# Patient Record
Sex: Female | Born: 1970 | Race: Black or African American | Hispanic: No | Smoking: Never smoker
Health system: Southern US, Community
[De-identification: ages and names within clinical notes are randomized; demographics above are authoritative.]

## PROBLEM LIST (undated history)

## (undated) DIAGNOSIS — J45909 Unspecified asthma, uncomplicated: Secondary | ICD-10-CM

## (undated) DIAGNOSIS — I10 Essential (primary) hypertension: Secondary | ICD-10-CM

## (undated) DIAGNOSIS — D219 Benign neoplasm of connective and other soft tissue, unspecified: Secondary | ICD-10-CM

## (undated) DIAGNOSIS — R200 Anesthesia of skin: Secondary | ICD-10-CM

## (undated) DIAGNOSIS — M5430 Sciatica, unspecified side: Secondary | ICD-10-CM

## (undated) DIAGNOSIS — J189 Pneumonia, unspecified organism: Secondary | ICD-10-CM

## (undated) HISTORY — DX: Benign neoplasm of connective and other soft tissue, unspecified: D21.9

## (undated) HISTORY — PX: TUBAL LIGATION: SHX77

---

## 1998-11-09 ENCOUNTER — Ambulatory Visit (HOSPITAL_COMMUNITY): Admission: RE | Admit: 1998-11-09 | Discharge: 1998-11-09 | Payer: Self-pay | Admitting: Family Medicine

## 1998-11-09 ENCOUNTER — Encounter: Payer: Self-pay | Admitting: Family Medicine

## 1999-01-10 ENCOUNTER — Emergency Department (HOSPITAL_COMMUNITY): Admission: EM | Admit: 1999-01-10 | Discharge: 1999-01-10 | Payer: Self-pay | Admitting: *Deleted

## 1999-01-10 ENCOUNTER — Encounter: Payer: Self-pay | Admitting: *Deleted

## 1999-08-04 ENCOUNTER — Other Ambulatory Visit: Admission: RE | Admit: 1999-08-04 | Discharge: 1999-08-04 | Payer: Self-pay | Admitting: Family Medicine

## 2005-10-21 ENCOUNTER — Emergency Department (HOSPITAL_COMMUNITY): Admission: EM | Admit: 2005-10-21 | Discharge: 2005-10-21 | Payer: Self-pay | Admitting: Emergency Medicine

## 2007-09-28 ENCOUNTER — Ambulatory Visit (HOSPITAL_COMMUNITY): Admission: RE | Admit: 2007-09-28 | Discharge: 2007-09-28 | Payer: Self-pay | Admitting: Family Medicine

## 2008-04-24 ENCOUNTER — Ambulatory Visit (HOSPITAL_COMMUNITY): Admission: RE | Admit: 2008-04-24 | Discharge: 2008-04-24 | Payer: Self-pay | Admitting: Obstetrics and Gynecology

## 2008-04-24 ENCOUNTER — Encounter (INDEPENDENT_AMBULATORY_CARE_PROVIDER_SITE_OTHER): Payer: Self-pay | Admitting: Obstetrics and Gynecology

## 2008-04-29 ENCOUNTER — Emergency Department (HOSPITAL_COMMUNITY): Admission: EM | Admit: 2008-04-29 | Discharge: 2008-04-29 | Payer: Self-pay | Admitting: Emergency Medicine

## 2010-02-06 ENCOUNTER — Encounter: Payer: Self-pay | Admitting: Family Medicine

## 2010-04-27 LAB — BASIC METABOLIC PANEL
CO2: 25 mEq/L (ref 19–32)
Calcium: 9.2 mg/dL (ref 8.4–10.5)
Chloride: 102 mEq/L (ref 96–112)
Creatinine, Ser: 0.77 mg/dL (ref 0.4–1.2)
GFR calc Af Amer: >60 mL/min (ref >60)
Sodium: 137 mEq/L (ref 135–145)

## 2010-04-27 LAB — DIFFERENTIAL
Basophils Absolute: 0 10*3/uL (ref 0.0–0.1)
Basophils Relative: 0 % (ref 0–1)
Eosinophils Absolute: 0.3 10*3/uL (ref 0.0–0.7)
Eosinophils Relative: 4 % (ref 0–5)
Lymphocytes Relative: 41 % (ref 12–46)
Lymphs Abs: 2.9 10*3/uL (ref 0.7–4.0)
Monocytes Absolute: 0.6 10*3/uL (ref 0.1–1.0)
Monocytes Relative: 9 % (ref 3–12)
Neutro Abs: 3.2 10*3/uL (ref 1.7–7.7)
Neutrophils Relative %: 45 % (ref 43–77)

## 2010-04-27 LAB — CBC
Hemoglobin: 9.4 g/dL — ABNORMAL LOW (ref 12.0–15.0)
Hemoglobin: 9.1 g/dL — ABNORMAL LOW (ref 12.0–15.0)
MCV: 85.2 fL (ref 78.0–100.0)
Platelets: 501 10*3/uL — ABNORMAL HIGH (ref 150–400)
RBC: 3.42 MIL/uL — ABNORMAL LOW (ref 3.87–5.11)
RBC: 3.29 MIL/uL — ABNORMAL LOW (ref 3.87–5.11)
RDW: 16.7 % — ABNORMAL HIGH (ref 11.5–15.5)
WBC: 7.1 10*3/uL (ref 4.0–10.5)

## 2010-04-27 LAB — HCG, SERUM, QUALITATIVE: Preg, Serum: NEGATIVE

## 2010-04-27 LAB — D-DIMER, QUANTITATIVE: D-Dimer, Quant: 0.73 ug/mL-FEU — ABNORMAL HIGH (ref 0.00–0.48)

## 2010-05-31 NOTE — H&P (Signed)
NAMEJAIMEY, FRANCHINI NO.:  [id number]   MEDICAL RECORD NO.:  [id number]          PATIENT TYPE:  AMB   LOCATION:                                FACILITY:  [facility]   PHYSICIAN:  Osborn Coho, M.D.   DATE OF BIRTH:  [birth year]   DATE OF ADMISSION:  04/24/2008  DATE OF DISCHARGE:                              HISTORY & PHYSICAL   HISTORY OF PRESENT ILLNESS:  Ms. Edwyna Ready is a 40 year old married African  American female para 2-0-0-2 who presents for hysteroscopy with D and C  and hydrothermal ablation because of a submucosal fibroid and  menorrhagia.  For over 2 years, the patient has had increasingly heavy  and extended menstrual flow with her worse episodes occurring over the  past 3 months.  The patient reports having a flow that will last  anywhere from 7-21 consecutive days, during which time she has to change  a super plus tampon and overnight pad on hourly basis.  In spite of  this, the patient still soil her clothes.  She also experiences 7/10 on  a 10-point pain scale, menstrual cramping, which will radiate to both of  her legs; however, she is able to find relief with Aleve, which will  decrease her pain to a 2/10 on a 10-point pain scale.  She denies any  urinary tract symptoms, any changes in bowel movements, nausea,  vomiting, diarrhea, fever or back pain.  In an effort to curtail the  quantity of the patient's menstrual flow, she was prescribed Ovcon 35 as  well as Provera 20 mg daily.  Neither of which, had any significant  impact on the patient's bleeding.  On January 2010, the patient's  hemoglobin was 10, hematocrit 32.5,  TSH and prolactin were within  normal limits, and pelvic ultrasound revealed a uterus measuring 5.0 x  9.8 x 7.2 cm with an anterior pedunculated fibroid measuring 5.1 x 5.0 x  5.1 cm and endometrium containing a hypoechoic mass measuring 2.8 x 2.4  cm.  The patient's right and left ovaries were within normal range.  The  patient was  given management options for her symptoms to include  hysteroscopy with D and C with and without endometrial ablation and  hysterectomy.  The patient has decided to proceed with hysteroscopy D  and C with hydrothermal ablation.   PAST MEDICAL HISTORY:   ALLERGIES:  The patient drug allergies, which are CODEINE, which causes  her to be hyperactive.  She also is allergic to NUTS, which causes her  throat to swell, severe diarrhea, and itching.   OB HISTORY:  Gravida 2, para 2-0-0-2.  The patient underwent 2 cesarean  sections.   GYN HISTORY:  Menarche at 40 years old.  The patient's last menstrual  period has been continuous for the past 3 weeks.  She uses bilateral  tubal ligation as her method of contraception.  Denies any history of  sexually transmitted diseases.  Denies any history of abnormal Pap  smears.   PAST MEDICAL HISTORY:  Anemia and bilateral lung pneumonia.   PAST SURGICAL HISTORY:  Surgical  history is negative.  The patient  denies any problems with anesthesia or any history of blood  transfusions.   FAMILY HISTORY:  Positive for cardiovascular disease, hypertension,  thyroid cancer, diabetes mellitus, rheumatoid arthritis and  schizophrenia (father).   HABITS:  The patient does not use tobacco, alcohol, or illicit drugs.   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   CURRENT MEDICATIONS:  1. Sudafed 12-hour, 1 tablet every 12 hours.  2. Iron Integra one tablet daily.  (The patient was given an injection      of progestin last week while on cruise in an effort to curtail      her bleeding; however specifics are not available and the patient      had no response with this injection).   REVIEW OF SYSTEMS:  The patient wears a permanent retainer in her mouth.  She denies any chest pain, shortness of breath, headache, vision  changes, nausea, vomiting, diarrhea, urinary tract symptoms,  myalgias,  arthralgias, fever, dizziness, blurred vision, and except as mentioned  in history of present illness the patient's review of systems is  otherwise negative.   PHYSICAL EXAMINATION:  VITAL SIGNS:  Blood pressure 130/62, pulse is 86,  weight is 267 pounds, and height 5 feet 5 inches.  Body mass index 44.4.  NECK:  Supple without masses.  There is no thyromegaly or cervical  adenopathy.  HEART:  Regular rate and rhythm.  LUNGS:  Clear.  BACK:  No CVA tenderness.  ABDOMEN:  No tenderness, guarding, rebound, or organomegaly.  PELVIC:  EGBUS is normal.  Vagina, large amount of blood with small  clots.  Cervix is nontender without lesions.  Uterus appears normal  size, shape and consistency.  However, exam is precluded or limited by  body habitus.  Adnexa without tenderness or masses.   IMPRESSION:  1. Menorrhagia.  2. Submucosal fibroid.  3. Anemia.   DISPOSITION:  A discussion was held with the patient regarding the  indications for her procedure along with its risks, which include but  are not limited to reaction to anesthesia, damage to adjacent organs,  infection, excessive bleeding and the possibility that her bleeding  pattern may not significantly improve as a result of her surgery.  The  patient verbalized understanding of these risks and has consented to  proceed with hysteroscopy with D and C and hydrothermal ablation.  The  patient has consented to proceed with hysteroscopy with D and C and  hydrothermal ablation at [facility] April 24, 2008  at 8:45 a.m.      [place] J. Adline Peals.      Osborn Coho, M.D.  Electronically Signed    EJP/MEDQ  D:  04/22/2008  T:  04/23/2008  Job:  [id number]

## 2010-05-31 NOTE — Op Note (Signed)
NAMEMarland Gill  JARAE, NEMMERS       ACCOUNT NO.:  [id number]   MEDICAL RECORD NO.:  [id number]          PATIENT TYPE:  AMB   LOCATION:  [facility]C                           FACILITY:  [facility]   PHYSICIAN:  Osborn Coho, M.D.   DATE OF BIRTH:  February 02, 1970   DATE OF PROCEDURE:  DATE OF DISCHARGE:                               OPERATIVE REPORT   PREOPERATIVE DIAGNOSES:  1. Menorrhagia.  2. Submucosal fibroid.  3. Anemia.   POSTOPERATIVE DIAGNOSES:  1. Menorrhagia.  2. Submucosal fibroid.  3. Anemia.   PROCEDURES:  1. Hysteroscopy.  2. Dilation and curettage.  3. Hydrothermal ablation.   ATTENDING:  Osborn Coho, MD   ANESTHESIA:  General.   FINDINGS:  Uterus sounded to 12 cm.   SPECIMENS TO PATHOLOGY:  Endometrial curettings.   FLUIDS:  1600 mL.   URINE OUTPUT:  Quantity sufficient.   ESTIMATED BLOOD LOSS:  Minimal.   COMPLICATIONS:  None.   PROCEDURE IN DETAIL:  The patient was taken to the operating room after  the risks, benefits, and alternatives discussed with the patient.  The  patient verbalized understanding and consent signed and witnessed.  The  patient was placed under general anesthesia and prepped and draped in a  normal sterile fashion in the dorsal lithotomy position.  A weighted  speculum was placed in the patient's vagina and the anterior lip of the  cervix was grasped with a single-tooth tenaculum.  Paracervical block  was administered using a total of 10 mL of 1% lidocaine.  The cervix was  already sufficiently dilated for passage of the hysteroscopic  hydrothermal ablation instrument.  __________ 12 cm, the hysteroscope  was introduced and as the patient was on her cycle, decision was made to  do D&C at this time to help control the bleeding.  Curettage was  performed until a gritty texture was noted and hysteroscope reintroduced  and the cavity was able to be visualized as well as the right ostia.  The ablation was performed and no noted deficits were  noted.  There was  still some bleeding noted in the left fundal region of the cavity and  ablation was performed for another 4 minutes, and no deficits noted.  All instruments were removed at the end of the case.  There was some  bleeding at the tenaculum sites and silver nitrate was applied to the  left tenaculum site.  There was good hemostasis.  Sponge, lap, and  needle count was correct.  The patient tolerated the procedure and is  currently being transferred to the recovery room in good condition.      Osborn Coho, M.D.  Electronically Signed     AR/MEDQ  D:  04/24/2008  T:  04/25/2008  Job:  [id number]

## 2010-10-19 LAB — COMPREHENSIVE METABOLIC PANEL
ALT: 34
AST: 27
CO2: 28
Chloride: 104
GFR calc Af Amer: >60
GFR calc non Af Amer: >60
Potassium: 4.1
Sodium: 139
Total Bilirubin: 0.5

## 2010-10-19 LAB — LIPID PANEL: Triglycerides: 80

## 2011-10-25 ENCOUNTER — Other Ambulatory Visit (HOSPITAL_COMMUNITY)
Admission: RE | Admit: 2011-10-25 | Discharge: 2011-10-25 | Disposition: A | Discharge status: Home / Self Care | Payer: BC Managed Care – PPO | Source: Ambulatory Visit | Attending: Family Medicine | Admitting: Family Medicine

## 2011-10-25 DIAGNOSIS — Z01419 Encounter for gynecological examination (general) (routine) without abnormal findings: Secondary | ICD-10-CM | POA: Insufficient documentation

## 2011-10-25 DIAGNOSIS — Z1151 Encounter for screening for human papillomavirus (HPV): Secondary | ICD-10-CM | POA: Insufficient documentation

## 2012-02-07 ENCOUNTER — Emergency Department (HOSPITAL_COMMUNITY): Payer: BC Managed Care – PPO

## 2012-02-07 ENCOUNTER — Encounter (HOSPITAL_COMMUNITY): Payer: Self-pay | Admitting: *Deleted

## 2012-02-07 ENCOUNTER — Emergency Department (HOSPITAL_COMMUNITY)
Admission: EM | Admit: 2012-02-07 | Discharge: 2012-02-08 | Disposition: A | Discharge status: Home / Self Care | Payer: BC Managed Care – PPO | Attending: Emergency Medicine | Admitting: Emergency Medicine

## 2012-02-07 DIAGNOSIS — R6883 Chills (without fever): Secondary | ICD-10-CM | POA: Insufficient documentation

## 2012-02-07 DIAGNOSIS — R1032 Left lower quadrant pain: Secondary | ICD-10-CM | POA: Insufficient documentation

## 2012-02-07 DIAGNOSIS — Z90710 Acquired absence of both cervix and uterus: Secondary | ICD-10-CM | POA: Insufficient documentation

## 2012-02-07 DIAGNOSIS — Z3202 Encounter for pregnancy test, result negative: Secondary | ICD-10-CM | POA: Insufficient documentation

## 2012-02-07 DIAGNOSIS — I10 Essential (primary) hypertension: Secondary | ICD-10-CM | POA: Insufficient documentation

## 2012-02-07 DIAGNOSIS — D259 Leiomyoma of uterus, unspecified: Secondary | ICD-10-CM

## 2012-02-07 HISTORY — DX: Essential (primary) hypertension: I10

## 2012-02-07 LAB — CBC WITH DIFFERENTIAL/PLATELET
Basophils Relative: 0 % (ref 0–1)
Eosinophils Absolute: 0.1 10*3/uL (ref 0.0–0.7)
Eosinophils Relative: 1 % (ref 0–5)
MCH: 30.4 pg (ref 26.0–34.0)
MCHC: 33.7 g/dL (ref 30.0–36.0)
Monocytes Relative: 12 % (ref 3–12)
Neutrophils Relative %: 59 % (ref 43–77)
Platelets: 323 10*3/uL (ref 150–400)

## 2012-02-07 LAB — BASIC METABOLIC PANEL
BUN: 9 mg/dL (ref 6–23)
Calcium: 8.6 mg/dL (ref 8.4–10.5)
GFR calc Af Amer: >90 mL/min (ref >90)
GFR calc non Af Amer: 79 mL/min — ABNORMAL LOW (ref >90)
Potassium: 3.8 mEq/L (ref 3.5–5.1)
Sodium: 132 mEq/L — ABNORMAL LOW (ref 135–145)

## 2012-02-07 LAB — URINALYSIS, ROUTINE W REFLEX MICROSCOPIC
Bilirubin Urine: NEGATIVE
Nitrite: NEGATIVE
Protein, ur: NEGATIVE mg/dL
Specific Gravity, Urine: 1.008 (ref 1.005–1.030)
Urobilinogen, UA: 0.2 mg/dL (ref 0.0–1.0)

## 2012-02-07 MED ORDER — SODIUM CHLORIDE 0.9 % IV SOLN
1000.0000 mL | Freq: Once | INTRAVENOUS | Status: AC
  Administered 2012-02-07: 1000 mL via INTRAVENOUS

## 2012-02-07 MED ORDER — IOHEXOL 300 MG/ML  SOLN: 25.0000 mL | Freq: Once | INTRAMUSCULAR | Status: AC | PRN

## 2012-02-07 MED ORDER — MORPHINE SULFATE 4 MG/ML IJ SOLN
4.0000 mg | Freq: Once | INTRAMUSCULAR | Status: AC
  Administered 2012-02-07: 4 mg via INTRAVENOUS
  Filled 2012-02-07: qty 1

## 2012-02-07 MED ORDER — OXYCODONE-ACETAMINOPHEN 5-325 MG PO TABS: 1.0000 | ORAL_TABLET | Freq: Four times a day (QID) | ORAL | Status: DC | PRN

## 2012-02-07 MED ORDER — IBUPROFEN 800 MG PO TABS: 800.0000 mg | ORAL_TABLET | Freq: Three times a day (TID) | ORAL | Status: DC | PRN

## 2012-02-07 MED ORDER — IOHEXOL 300 MG/ML  SOLN
100.0000 mL | Freq: Once | INTRAMUSCULAR | Status: AC | PRN
  Administered 2012-02-07: 100 mL via INTRAVENOUS

## 2012-02-07 MED ORDER — ONDANSETRON HCL 4 MG/2ML IJ SOLN
4.0000 mg | Freq: Four times a day (QID) | INTRAMUSCULAR | Status: DC | PRN
  Administered 2012-02-07: 4 mg via INTRAVENOUS
  Filled 2012-02-07: qty 2

## 2012-02-07 MED ORDER — SODIUM CHLORIDE 0.9 % IV SOLN
1000.0000 mL | INTRAVENOUS | Status: DC
  Administered 2012-02-07: 1000 mL via INTRAVENOUS

## 2012-02-07 NOTE — ED Provider Notes (Signed)
History    CSN: [id number] Arrival date & time 02/07/12  2440 First MD Initiated Contact with Patient 02/07/12 1851     Chief Complaint  Patient presents with  . Leg Pain  . Groin Pain   HPI  Patient presents to the emergency room with complaints of left groin pain. Patient had been on a trip to [place] with her family. She initially noticed pain in her thigh. She felt that it was an intense sharp throbbing discomfort. She went to the [facility] clinic while she was there and had an imaging test to rule out DVT. The patient is taking hydrocodone for pain as well as Flexeril and ibuprofen. They suggested to her that it was probably a muscle strain. Since yesterday however the pain has now progressed to  the left groin area and her lower abdomen. She also feels some chills but is not having any trouble with nausea, vomiting, diarrhea, or dysuria. Pain is moderate to severe. Palpation increases the pain. No vaginal discharge.  NO sexual intercourse in the last 2 years.  Past Medical History  Diagnosis Date  . Hypertension     Past Surgical History  Procedure Date  .  myomectomy      History reviewed. No pertinent family history.  History  Substance Use Topics  . Smoking status: Never Smoker   . Smokeless tobacco: Never Used  . Alcohol Use: No    OB History    Grav Para Term Preterm Abortions TAB SAB Ect Mult Living                  Review of Systems  All other systems reviewed and are negative.    Allergies  Review of patient's allergies indicates no known allergies.  Home Medications   Current Outpatient Rx  Name  Route  Sig  Dispense  Refill  . CYCLOBENZAPRINE HCL 5 MG PO TABS   Oral   Take 5 mg by mouth 3 (three) times daily as needed. For pain         . HYDROCODONE-ACETAMINOPHEN 5-325 MG PO TABS   Oral   Take 1 tablet by mouth every 6 (six) hours as needed. For pain         . IBUPROFEN 800 MG PO TABS   Oral   Take 800 mg by mouth every 8 (eight) hours as  needed. For pain           BP 95/62  Pulse 77  Temp 98.5 F (36.9 C) (Oral)  Resp 18  SpO2 100%  Physical Exam  Nursing note and vitals reviewed. Constitutional: She appears well-developed and well-nourished. No distress.  HENT:  Head: Normocephalic and atraumatic.  Right Ear: External ear normal.  Left Ear: External ear normal.  Eyes: Conjunctivae normal are normal. Right eye exhibits no discharge. Left eye exhibits no discharge. No scleral icterus.  Neck: Neck supple. No tracheal deviation present.  Cardiovascular: Normal rate, regular rhythm and intact distal pulses.   Pulmonary/Chest: Effort normal and breath sounds normal. No stridor. No respiratory distress. She has no wheezes. She has no rales.  Abdominal: Soft. Bowel sounds are normal. She exhibits no distension. There is tenderness in the left lower quadrant. There is no rebound, no guarding and no CVA tenderness. No hernia.  Musculoskeletal: She exhibits no edema and no tenderness.       No edema, no thigh tenderness, no erythema  Neurological: She is alert. She has normal strength. No sensory deficit. Cranial nerve deficit:  no gross defecits noted. She exhibits normal muscle tone. She displays no seizure activity. Coordination normal.  Skin: Skin is warm and dry. No rash noted.  Psychiatric: She has a normal mood and affect.    ED Course  Procedures (including critical care time)  Labs Reviewed  CBC WITH DIFFERENTIAL - Abnormal; Notable for the following:    WBC 11.7 (*)     Monocytes Absolute 1.4 (*)     All other components within normal limits  BASIC METABOLIC PANEL - Abnormal; Notable for the following:    Sodium 132 (*)     GFR calc non Af Amer 79 (*)     All other components within normal limits  URINALYSIS, ROUTINE W REFLEX MICROSCOPIC  PREGNANCY, URINE   Ct Abdomen Pelvis W Contrast  02/07/2012  *RADIOLOGY REPORT*  Clinical Data: Left groin pain.  CT ABDOMEN AND PELVIS WITH CONTRAST  Technique:   Multidetector CT imaging of the abdomen and pelvis was performed following the standard protocol during bolus administration of intravenous contrast.  Contrast: OMNIPAQUE IOHEXOL 300 MG/ML  SOLN  Comparison: None.  Findings: Lung bases are clear. No evidence of free air.  Normal appearance of the liver, portal venous system and gallbladder. Normal appearance of the spleen, pancreas, adrenal glands and both kidneys.  No lymphadenopathy. There is mild wall thickening in the distal stomach which is likely related to under distention.  They are two prominent nodular structures along the anterior aspect of the uterus.  The lesion closer to the fundus measures 4.8 x 5.6 x 4.8 cm.  This lesion is hypodense and could represent a degenerating or necrotic large fibroid. There is a small amount of fluid just anterior to this lesion and the uterus.  This could be related to inflammation or related to the adjacent left adnexal tissue.  The other uterine lesion is near the lower uterine segment and measures 7.3 x 7.0 x 5.4 cm. There are additional small uterine fibroids.  No gross abnormality to the adnexa tissue.  No acute bony abnormality.  IMPRESSION: There are two large lesions involving the uterus that are most consistent with fibroids. The lesion near the fundus is hypodense and could represent a necrotic or degenerating fibroid.  Small amount of fluid just anterior to the uterus could be related to inflammation from the adjacent fibroid or could be physiologic fluid from the nearby left adnexa.   Original Report Authenticated By: Richarda Overlie, M.D.      1. Uterine fibroid       MDM  Patient CT scan shows that she is 2 large lesions most consistent with uterine fibroids. There is one area that could be consistent with a necrotic or degenerating fibroid.  The patient's prior leg pain was most likely related to a referred pain.  At this point, the patient is feeling better after treatment. I discussed the  findings with her. She will followup with her gynecologist.        Celene Kras, MD 02/07/12 [id number]

## 2012-02-07 NOTE — ED Notes (Signed)
Pt states yesterday was seen at the [facility] for severe L knee/thigh pain, was told negative for blood clot after scan, pt states pain is moving up to groin area now, been having chills, denies fever.

## 2012-02-08 MED ORDER — OXYCODONE-ACETAMINOPHEN 5-325 MG PO TABS: 1.0000 | ORAL_TABLET | Freq: Four times a day (QID) | ORAL | Status: DC | PRN

## 2012-02-09 ENCOUNTER — Ambulatory Visit: Payer: BC Managed Care – PPO | Admitting: Obstetrics and Gynecology

## 2012-02-09 ENCOUNTER — Encounter: Payer: Self-pay | Admitting: Obstetrics and Gynecology

## 2012-02-09 VITALS — BP 104/62 | Resp 18 | Ht 65.0 in | Wt 244.0 lb

## 2012-02-09 DIAGNOSIS — R102 Pelvic and perineal pain: Secondary | ICD-10-CM

## 2012-02-09 DIAGNOSIS — D219 Benign neoplasm of connective and other soft tissue, unspecified: Secondary | ICD-10-CM | POA: Insufficient documentation

## 2012-02-09 DIAGNOSIS — Z139 Encounter for screening, unspecified: Secondary | ICD-10-CM

## 2012-02-09 DIAGNOSIS — N920 Excessive and frequent menstruation with regular cycle: Secondary | ICD-10-CM | POA: Insufficient documentation

## 2012-02-09 DIAGNOSIS — N946 Dysmenorrhea, unspecified: Secondary | ICD-10-CM | POA: Insufficient documentation

## 2012-02-09 LAB — POCT URINALYSIS DIPSTICK
Bilirubin, UA: NEGATIVE
Spec Grav, UA: <=1.005
pH, UA: 5

## 2012-02-09 LAB — CBC
HCT: 36.6 % (ref 36.0–46.0)
Hemoglobin: 12.5 g/dL (ref 12.0–15.0)
MCH: 30 pg (ref 26.0–34.0)
MCHC: 34.2 g/dL (ref 30.0–36.0)
MCV: 88 fL (ref 78.0–100.0)
RDW: 13 % (ref 11.5–15.5)

## 2012-02-09 MED ORDER — KETOROLAC TROMETHAMINE 60 MG/2ML IM SOLN
60.0000 mg | Freq: Once | INTRAMUSCULAR | Status: AC
  Administered 2012-02-09: 60 mg via INTRAMUSCULAR

## 2012-02-09 NOTE — Addendum Note (Signed)
Addended by: Marla Roe A on: 02/09/2012 11:45 AM   Modules accepted: Orders

## 2012-02-09 NOTE — Progress Notes (Signed)
Here secondary to excessive pain and cramping down left thigh on Tues.  A CT scan revealed fibroids one which was probably degenerating.  H/o 2 csections and hysteroscopic myomectomy  Filed Vitals:   02/09/12 1036  BP: 104/62  Resp: 18   ROS: noncontributory  Pelvic exam:  VULVA: normal appearing vulva with no masses, tenderness or lesions,  VAGINA: normal appearing vagina with normal color and discharge, no lesions, lt bleeding CERVIX: normal appearing cervix without discharge or lesions,  UTERUS: uterus is ?normal size, shape, consistency and nontender, difficult secondary to habitus ADNEXA: normal adnexa in size, nontender and no masses.  A/P Likely degenerating fibroid per CT scan - pt has percocet already - rx motrin - toradol shot today 60mg  x 1 - pt is slightly improved from the other day though sched u/s and do labs today rto after u/s for f/u and f/u labs and discuss plan

## 2012-02-12 ENCOUNTER — Other Ambulatory Visit: Payer: Self-pay

## 2012-02-12 ENCOUNTER — Telehealth: Payer: Self-pay

## 2012-02-12 DIAGNOSIS — E559 Vitamin D deficiency, unspecified: Secondary | ICD-10-CM

## 2012-02-12 MED ORDER — VITAMIN D3 1.25 MG (50000 UT) PO CAPS: 1.0000 | ORAL_CAPSULE | ORAL | Status: DC

## 2012-02-12 NOTE — Telephone Encounter (Signed)
Pt notified of Low Vit D level. Vit D protocol called to Pt's preferred pharmacy. Recall and future lab ordered. Melody Comas A

## 2012-02-23 ENCOUNTER — Ambulatory Visit (INDEPENDENT_AMBULATORY_CARE_PROVIDER_SITE_OTHER): Payer: BC Managed Care – PPO | Admitting: Family Medicine

## 2012-02-23 VITALS — BP 118/80 | HR 116 | Temp 99.5°F | Resp 18 | Ht 66.5 in | Wt 243.0 lb

## 2012-02-23 DIAGNOSIS — R109 Unspecified abdominal pain: Secondary | ICD-10-CM

## 2012-02-23 DIAGNOSIS — J029 Acute pharyngitis, unspecified: Secondary | ICD-10-CM

## 2012-02-23 DIAGNOSIS — J020 Streptococcal pharyngitis: Secondary | ICD-10-CM

## 2012-02-23 DIAGNOSIS — J111 Influenza due to unidentified influenza virus with other respiratory manifestations: Secondary | ICD-10-CM

## 2012-02-23 DIAGNOSIS — R6889 Other general symptoms and signs: Secondary | ICD-10-CM

## 2012-02-23 DIAGNOSIS — R319 Hematuria, unspecified: Secondary | ICD-10-CM

## 2012-02-23 LAB — POCT UA - MICROSCOPIC ONLY
Casts, Ur, LPF, POC: NEGATIVE
Crystals, Ur, HPF, POC: NEGATIVE
Yeast, UA: NEGATIVE

## 2012-02-23 LAB — POCT URINALYSIS DIPSTICK
Bilirubin, UA: NEGATIVE
Glucose, UA: NEGATIVE
Ketones, UA: NEGATIVE
Leukocytes, UA: NEGATIVE
Nitrite, UA: NEGATIVE
Protein, UA: 30
Spec Grav, UA: 1.015
Urobilinogen, UA: 0.2
pH, UA: 7

## 2012-02-23 LAB — POCT RAPID STREP A (OFFICE): Rapid Strep A Screen: POSITIVE — AB

## 2012-02-23 LAB — POCT INFLUENZA A/B
Influenza A, POC: NEGATIVE
Influenza B, POC: NEGATIVE

## 2012-02-23 MED ORDER — AMOXICILLIN 875 MG PO TABS: 875.0000 mg | ORAL_TABLET | Freq: Two times a day (BID) | ORAL | Status: DC

## 2012-02-23 NOTE — Progress Notes (Signed)
[facility]:  Office Visit  Chief Complaint:  Chief Complaint  Patient presents with  . Sore Throat  . Generalized Body Aches  . Cough  . Dizziness  . Back Pain    HPI: Susan Gill is a 42 y.o. female who complains of :  1. Sore throat, msk pain/aches, neck pain, Denies rashes. Tender to touch on forehead. Subjective Fever yesterday and last night, No nausea, vomiting or abd pain. Has had difficulty swallowing due to throat pain. Has gotten the  flu vaccine.  Has taken  OTC Multimed flu medicine without relief.   2. Changes in urine stream x several days, has h/o degenerative fibroid. Is getting an [place] for fibroids, has had myomectomy. Sxs Came back 2 weeks ago.    Past Medical History  Diagnosis Date  . Hypertension   . Fibroids    Past Surgical History  Procedure Laterality Date  . Abdominal hysterectomy    . Cesarean section    . Tubal ligation     History   Social History  . Marital Status: Divorced    Spouse Name: N/A    Number of Children: N/A  . Years of Education: N/A   Social History Main Topics  . Smoking status: Never Smoker   . Smokeless tobacco: Never Used  . Alcohol Use: No  . Drug Use: No  . Sexually Active: No   Other Topics Concern  . None   Social History Narrative  . None   Family History  Problem Relation Age of Onset  . Cancer Mother     thyroid  . Diabetes Father   . Hypertension Maternal Grandmother   . Kidney disease Maternal Grandmother   . Diabetes Paternal Grandmother   . Diabetes Paternal Grandfather    No Known Allergies Prior to Admission medications   Medication Sig Start Date End Date Taking? Authorizing Provider  Cholecalciferol (VITAMIN D3) 50000 UNITS CAPS Take 1 capsule by mouth once a week. 02/12/12  Yes Purcell Nails, MD  Azilsartan-Chlorthalidone (EDARBYCLOR) 40-12.5 MG TABS Take by mouth.    Historical Provider, MD  ibuprofen (ADVIL,MOTRIN) 800 MG tablet Take 1 tablet (800 mg  total) by mouth every 8 (eight) hours as needed. For pain 02/07/12   Celene Kras, MD  oxyCODONE-acetaminophen (PERCOCET/ROXICET) 5-325 MG per tablet Take 1-2 tablets by mouth every 6 (six) hours as needed for pain. 02/08/12   Celene Kras, MD     ROS: The patient denies  night sweats, unintentional weight loss, chest pain, palpitations, wheezing, dyspnea on exertion, nausea, vomiting, abdominal pain, hematuria, melena, numbness, weakness, or tingling.  All other systems have been reviewed and were otherwise negative with the exception of those mentioned in the HPI and as above.    PHYSICAL EXAM: Filed Vitals:   02/23/12 1540  BP: 118/80  Pulse: 116  Temp: 99.5 F (37.5 C)  Resp: 18   Filed Vitals:   02/23/12 1540  Height: 5' 6.5" (1.689 m)  Weight: 243 lb (110.224 kg)   Body mass index is 38.64 kg/(m^2).  General: Alert, no acute distress HEENT:  Normocephalic, atraumatic, oropharynx patent. No exudates, + erythematous tonsils, + 3 Cardiovascular:  Tachycardic, Regular  rhythm, no rubs murmurs or gallops.  No Carotid bruits, radial pulse intact. No pedal edema.  Respiratory: Clear to auscultation bilaterally.  No wheezes, rales, or rhonchi.  No cyanosis, no use of accessory musculature GI: No organomegaly, abdomen is soft and non-tender, positive bowel sounds.  No masses. Skin: No rashes. Neurologic: Facial musculature symmetric. No meningeal signs.  Psychiatric: Patient is appropriate throughout our interaction. Lymphatic: No cervical lymphadenopathy Musculoskeletal: Gait intact.   LABS: Results for orders placed in visit on 02/23/12  URINE CULTURE      Result Value Range   Colony Count 35,000 COLONIES/ML     Organism ID, Bacteria Multiple bacterial morphotypes present, none     Organism ID, Bacteria predominant. Suggest appropriate recollection if      Organism ID, Bacteria clinically indicated.    POCT RAPID STREP A (OFFICE)      Result Value Range   Rapid Strep A  Screen Positive (*) Negative  POCT INFLUENZA A/B      Result Value Range   Influenza A, POC Negative     Influenza B, POC Negative    POCT UA - MICROSCOPIC ONLY      Result Value Range   WBC, Ur, HPF, POC 1-6     RBC, urine, microscopic 0-3     Bacteria, U Microscopic 3+     Mucus, UA mod     Epithelial cells, urine per micros 2-8     Crystals, Ur, HPF, POC neg     Casts, Ur, LPF, POC neg     Yeast, UA neg    POCT URINALYSIS DIPSTICK      Result Value Range   Color, UA yellow     Clarity, UA clear     Glucose, UA neg     Bilirubin, UA neg     Ketones, UA neg     Spec Grav, UA 1.015     Blood, UA trace-intact     pH, UA 7.0     Protein, UA 30     Urobilinogen, UA 0.2     Nitrite, UA neg     Leukocytes, UA Negative       EKG/XRAY:   Primary read interpreted by Dr. Conley Rolls at [facility].   ASSESSMENT/PLAN: Encounter Diagnoses  Name Primary?  . Sore throat   . Flu-like symptoms   . Flank pain   . Hematuria   . Strep pharyngitis Yes   [facility] Dr. Osborn Coho ( need labs faxed over), appt is on Feb 12 at 10 AM. D/w patient labs above including hematuria. She will address this with Dr. Su Hilt. Rx Amoxacillin. Will hold off on steroids for now, if she has problems swallowing or tonsils become more enlarged and painful then will rx steroids. F/u prn.     Susan Rallis PHUONG, DO 02/27/2012 8:51 PM

## 2012-02-23 NOTE — Patient Instructions (Signed)

## 2012-02-25 LAB — URINE CULTURE: Colony Count: 35000

## 2012-02-27 ENCOUNTER — Encounter: Payer: Self-pay | Admitting: Family Medicine

## 2012-02-28 ENCOUNTER — Ambulatory Visit: Payer: BC Managed Care – PPO | Admitting: Obstetrics and Gynecology

## 2012-02-28 ENCOUNTER — Ambulatory Visit: Payer: BC Managed Care – PPO

## 2012-02-28 ENCOUNTER — Telehealth: Payer: Self-pay | Admitting: Radiology

## 2012-02-28 ENCOUNTER — Other Ambulatory Visit: Payer: Self-pay | Admitting: Obstetrics and Gynecology

## 2012-02-28 ENCOUNTER — Encounter: Payer: Self-pay | Admitting: Obstetrics and Gynecology

## 2012-02-28 VITALS — BP 112/58 | HR 78 | Wt 246.0 lb

## 2012-02-28 DIAGNOSIS — N946 Dysmenorrhea, unspecified: Secondary | ICD-10-CM

## 2012-02-28 DIAGNOSIS — D219 Benign neoplasm of connective and other soft tissue, unspecified: Secondary | ICD-10-CM

## 2012-02-28 DIAGNOSIS — D251 Intramural leiomyoma of uterus: Secondary | ICD-10-CM

## 2012-02-28 NOTE — Telephone Encounter (Signed)
Faxed note and advised patient of negative culture/ I think Dr Su Hilt has epic, but note faxed anyway, to make sure.

## 2012-02-28 NOTE — Progress Notes (Signed)
Here to f/u u/s and s/p episode of degenerating fibroid.  Pt had ablation several yrs ago 04/2008 had HTA.  Still has light cycles but no heavy or abnl bleeding.   She liked that toradol shot and would like to be able to get it prn.  Filed Vitals:   02/28/12 1128  BP: 112/58  Pulse: 78   U/S - Ut 9.7x8.3x6.7, nl bil ovaries, rt 2.8, lt 2.7, fibroids x 2 (1.5.5cm, 2.3.8cm) A/P Likely had degenerating fibroid - pt still present but much better I discussed options and recs - Pt agreeable to try depo lupron and get mirena toward end of the (sched visit for GC/CT prior to mirena insertion) She may have toradol 60mg  IM x 1 prn

## 2012-02-28 NOTE — Telephone Encounter (Signed)
Message copied by Caffie Damme on Wed Feb 28, 2012 11:35 AM ------      Message from: Lenell Antu      Created: Tue Feb 27, 2012  8:53 PM       Please call patient and say her urine cx did not grow anything.            Please fax my office notes and all the labs from my OV for patient to Dr. Su Hilt at [facility]. The patient has an appt with Dr. Su Hilt on 02/28/12.            Thx,      Tle ------

## 2012-03-04 ENCOUNTER — Telehealth: Payer: Self-pay

## 2012-03-04 ENCOUNTER — Emergency Department (HOSPITAL_COMMUNITY)
Admission: EM | Admit: 2012-03-04 | Discharge: 2012-03-04 | Disposition: A | Discharge status: Home / Self Care | Payer: BC Managed Care – PPO | Attending: Emergency Medicine | Admitting: Emergency Medicine

## 2012-03-04 ENCOUNTER — Encounter (HOSPITAL_COMMUNITY): Payer: Self-pay | Admitting: Emergency Medicine

## 2012-03-04 ENCOUNTER — Emergency Department (HOSPITAL_COMMUNITY): Payer: BC Managed Care – PPO

## 2012-03-04 DIAGNOSIS — M79609 Pain in unspecified limb: Secondary | ICD-10-CM | POA: Insufficient documentation

## 2012-03-04 DIAGNOSIS — R5383 Other fatigue: Secondary | ICD-10-CM | POA: Insufficient documentation

## 2012-03-04 DIAGNOSIS — Z8742 Personal history of other diseases of the female genital tract: Secondary | ICD-10-CM | POA: Insufficient documentation

## 2012-03-04 DIAGNOSIS — M5412 Radiculopathy, cervical region: Secondary | ICD-10-CM | POA: Insufficient documentation

## 2012-03-04 DIAGNOSIS — M79601 Pain in right arm: Secondary | ICD-10-CM

## 2012-03-04 DIAGNOSIS — I10 Essential (primary) hypertension: Secondary | ICD-10-CM | POA: Insufficient documentation

## 2012-03-04 DIAGNOSIS — M25519 Pain in unspecified shoulder: Secondary | ICD-10-CM | POA: Insufficient documentation

## 2012-03-04 DIAGNOSIS — Z79899 Other long term (current) drug therapy: Secondary | ICD-10-CM | POA: Insufficient documentation

## 2012-03-04 DIAGNOSIS — R5381 Other malaise: Secondary | ICD-10-CM | POA: Insufficient documentation

## 2012-03-04 LAB — CBC WITH DIFFERENTIAL/PLATELET
Basophils Absolute: 0 10*3/uL (ref 0.0–0.1)
Basophils Relative: 0 % (ref 0–1)
Eosinophils Absolute: 0.3 10*3/uL (ref 0.0–0.7)
Hemoglobin: 13.1 g/dL (ref 12.0–15.0)
MCH: 30 pg (ref 26.0–34.0)
MCHC: 33.4 g/dL (ref 30.0–36.0)
Monocytes Relative: 13 % — ABNORMAL HIGH (ref 3–12)
Neutro Abs: 4.2 10*3/uL (ref 1.7–7.7)
Neutrophils Relative %: 57 % (ref 43–77)
Platelets: 359 10*3/uL (ref 150–400)

## 2012-03-04 LAB — BASIC METABOLIC PANEL
Chloride: 100 mEq/L (ref 96–112)
GFR calc Af Amer: >90 mL/min (ref >90)
GFR calc non Af Amer: >90 mL/min (ref >90)
Potassium: 3.9 mEq/L (ref 3.5–5.1)
Sodium: 135 mEq/L (ref 135–145)

## 2012-03-04 LAB — POCT I-STAT TROPONIN I: Troponin i, poc: 0 ng/mL (ref 0.00–0.08)

## 2012-03-04 MED ORDER — METHOCARBAMOL 500 MG PO TABS: 500.0000 mg | ORAL_TABLET | Freq: Two times a day (BID) | ORAL | Status: DC

## 2012-03-04 MED ORDER — DEXTROMETHORPHAN POLISTIREX 30 MG/5ML PO LQCR: 60.0000 mg | ORAL | Status: DC | PRN

## 2012-03-04 MED ORDER — KETOROLAC TROMETHAMINE 60 MG/2ML IM SOLN
60.0000 mg | Freq: Once | INTRAMUSCULAR | Status: AC
  Administered 2012-03-04: 60 mg via INTRAMUSCULAR
  Filled 2012-03-04: qty 2

## 2012-03-04 MED ORDER — NAPROXEN 500 MG PO TABS: 500.0000 mg | ORAL_TABLET | Freq: Two times a day (BID) | ORAL | Status: DC

## 2012-03-04 NOTE — ED Notes (Signed)
Pt has been sick and on medication antibiotics for a cold and sore throat. Pt has been taking a half a pill for her bp, alert x4 pain and weakness only to rt arm and shoulder and to rt side of neck while at work approx 30 mins ago. No mobility difficulties, no slurred speech.

## 2012-03-04 NOTE — ED Notes (Signed)
Patient transported to X-ray 

## 2012-03-04 NOTE — ED Provider Notes (Signed)
History     CSN: [id number]  Arrival date & time 03/04/12  7829   First MD Initiated Contact with Patient 03/04/12 1001      Chief Complaint  Patient presents with  . Weakness    (Consider location/radiation/quality/duration/timing/severity/associated sxs/prior treatment) HPI Comments: This is a 42 year old female, past medical history remarkable for hypertension, who presents emergency department with chief complaint of right-sided arm and shoulder pain. Patient tells me that she's been having pain since she woke this morning. Patient states that the pain radiates down her right arm. She states that the pain is severe.  She states that the pain is worsened with movement.  She denies any slurred speech.  Patient states that she is concerned that she is having a heart attack because her father had a massive MI at age 80.    The history is provided by the patient. No language interpreter was used.    Past Medical History  Diagnosis Date  . Hypertension   . Fibroids     Past Surgical History  Procedure Laterality Date  . Abdominal hysterectomy    . Cesarean section    . Tubal ligation      Family History  Problem Relation Age of Onset  . Cancer Mother     thyroid  . Diabetes Father   . Hypertension Maternal Grandmother   . Kidney disease Maternal Grandmother   . Diabetes Paternal Grandmother   . Diabetes Paternal Grandfather     History  Substance Use Topics  . Smoking status: Never Smoker   . Smokeless tobacco: Never Used  . Alcohol Use: No    OB History   Grav Para Term Preterm Abortions TAB SAB Ect Mult Living   2 2 2       2       Review of Systems  All other systems reviewed and are negative.    Allergies  Review of patient's allergies indicates no known allergies.  Home Medications   Current Outpatient Rx  Name  Route  Sig  Dispense  Refill  . amoxicillin (AMOXIL) 875 MG tablet   Oral   Take 1 tablet (875 mg total) by mouth 2 (two) times  daily.   20 tablet   0   . Azilsartan-Chlorthalidone (EDARBYCLOR) 40-12.5 MG TABS   Oral   Take by mouth.         . Cholecalciferol (VITAMIN D3) 50000 UNITS CAPS   Oral   Take 1 capsule by mouth once a week.   20 capsule   0   . ibuprofen (ADVIL,MOTRIN) 800 MG tablet   Oral   Take 1 tablet (800 mg total) by mouth every 8 (eight) hours as needed. For pain   30 tablet   0   . oxyCODONE-acetaminophen (PERCOCET/ROXICET) 5-325 MG per tablet   Oral   Take 1-2 tablets by mouth every 6 (six) hours as needed for pain.   30 tablet   0     BP 126/74  Pulse 86  Temp(Src) 98.6 F (37 C) (Oral)  SpO2 100%  LMP 02/08/2012  Physical Exam  Nursing note and vitals reviewed. Constitutional: She is oriented to person, place, and time. She appears well-developed and well-nourished.  HENT:  Head: Normocephalic and atraumatic.  Eyes: Conjunctivae and EOM are normal. Pupils are equal, round, and reactive to light.  Neck: Normal range of motion. Neck supple.  Cardiovascular: Normal rate and regular rhythm.  Exam reveals no gallop and no friction rub.  No murmur heard. Pulmonary/Chest: Effort normal and breath sounds normal. No respiratory distress. She has no wheezes. She has no rales. She exhibits no tenderness.  Abdominal: Soft. Bowel sounds are normal. She exhibits no distension and no mass. There is no tenderness. There is no rebound and no guarding.  Musculoskeletal: Normal range of motion. She exhibits tenderness. She exhibits no edema.  Upper trapezius muscle body tenderness palpation, no gross abnormalities or deformities the right shoulder, right elbow, right wrist and hand. Range of motion and strength is 5 out of 5 bilaterally, however, pain with movement in right arm.  Neurological: She is alert and oriented to person, place, and time.  Skin: Skin is warm and dry.  No rashes, or lesions on the skin  Psychiatric: She has a normal mood and affect. Her behavior is normal.  Judgment and thought content normal.    ED Course  Procedures (including critical care time)  Labs Reviewed  CBC WITH DIFFERENTIAL - Abnormal; Notable for the following:    Monocytes Relative 13 (*)    All other components within normal limits  BASIC METABOLIC PANEL  POCT I-STAT TROPONIN I   Results for orders placed during the hospital encounter of 03/04/12  CBC WITH DIFFERENTIAL      Result Value Range   WBC 7.5  4.0 - 10.5 K/uL   RBC 4.37  3.87 - 5.11 MIL/uL   Hemoglobin 13.1  12.0 - 15.0 g/dL   HCT 16.1  09.6 - 04.5 %   MCV 89.7  78.0 - 100.0 fL   MCH 30.0  26.0 - 34.0 pg   MCHC 33.4  30.0 - 36.0 g/dL   RDW 40.9  81.1 - 91.4 %   Platelets 359  150 - 400 K/uL   Neutrophils Relative 57  43 - 77 %   Neutro Abs 4.2  1.7 - 7.7 K/uL   Lymphocytes Relative 25  12 - 46 %   Lymphs Abs 1.9  0.7 - 4.0 K/uL   Monocytes Relative 13 (*) 3 - 12 %   Monocytes Absolute 1.0  0.1 - 1.0 K/uL   Eosinophils Relative 4  0 - 5 %   Eosinophils Absolute 0.3  0.0 - 0.7 K/uL   Basophils Relative 0  0 - 1 %   Basophils Absolute 0.0  0.0 - 0.1 K/uL  BASIC METABOLIC PANEL      Result Value Range   Sodium 135  135 - 145 mEq/L   Potassium 3.9  3.5 - 5.1 mEq/L   Chloride 100  96 - 112 mEq/L   CO2 24  19 - 32 mEq/L   Glucose, Bld 93  70 - 99 mg/dL   BUN 11  6 - 23 mg/dL   Creatinine, Ser 7.82  0.50 - 1.10 mg/dL   Calcium 9.2  8.4 - 95.6 mg/dL   GFR calc non Af Amer >90  >90 mL/min   GFR calc Af Amer >90  >90 mL/min  POCT I-STAT TROPONIN I      Result Value Range   Troponin i, poc 0.00  0.00 - 0.08 ng/mL   Comment 3            Dg Chest 2 View  03/04/2012  *RADIOLOGY REPORT*  Clinical Data: Shortness of breath  CHEST - 2 VIEW  Comparison: April 29, 2008  Findings:  Lungs clear.  Heart size and pulmonary vascularity are normal.  No adenopathy.  No bone lesions. There is degenerative change in the thoracic  spine.  IMPRESSION:  No edema or consolidation.   Original Report Authenticated By: Bretta Bang, M.D.    Dg Shoulder Right  03/04/2012  *RADIOLOGY REPORT*  Clinical Data: Pain  RIGHT SHOULDER - 2+ VIEW  Comparison: None  Findings: There is no evidence of fracture or dislocation.  There is no evidence of arthropathy or other focal bone abnormality. Soft tissues are unremarkable.  IMPRESSION: Negative examination.   Original Report Authenticated By: Signa Kell, M.D.    Dg Humerus Right  03/04/2012  *RADIOLOGY REPORT*  Clinical Data: Arm pain  RIGHT HUMERUS - 2+ VIEW  Comparison: None.  Findings: There is no evidence of fracture or dislocation.  There is no evidence of arthropathy or other focal bone abnormality. Soft tissues are unremarkable.  IMPRESSION: Negative exam.   Original Report Authenticated By: Signa Kell, M.D.       1. Right arm pain   2. Cervical radiculopathy       MDM  42 year old female with right arm pain. Patient describes the are pain as weakness, but when questioned further, she states that the reported weakness is more of a hesitancy to move the arm because it hurts.  She has not had any dizziness, slurred speech, or any other neurologic deficits.  Patient is very concerned about having a heart attack because her father had a massive MI at a young age. I told her that we could do some basic blood tests, to which she agreed.  I am going to give the patient a shot of toradol, and will re-evaluate.  Patient states that she feels like her arm is broken, and asks for xray.  12:34 PM Patient's pain improved with toradol.  Will discharge with naprosyn and robaxin.  Will refer to ortho.  Patient understands and agrees with the plan.  She is stable and ready for discharge.       Roxy Horseman, PA-C 03/04/12 1235

## 2012-03-04 NOTE — ED Provider Notes (Signed)
Medical screening examination/treatment/procedure(s) were performed by non-physician practitioner and as supervising physician I was immediately available for consultation/collaboration. Devoria Albe, MD, Armando Gang   Ward Givens, MD 03/04/12 [phone]

## 2012-03-04 NOTE — Telephone Encounter (Signed)
Spoke to Pharmacist at [employer] specialty pharmacy [facility] to call in  Lupron Depot 11.25 mg syringe # 1 with 1 prn RF to be shipped to our office and administered IM by our office staff.. Pharmacy will contact pt. And then call us when they ship it out.  Rx called to # [phone]. Melody Comas A This drug does not require prior authorization. I spent an hr dealing with this.

## 2012-03-04 NOTE — ED Notes (Signed)
Patient transported to CT 

## 2012-03-11 ENCOUNTER — Telehealth: Payer: Self-pay

## 2012-03-11 NOTE — Telephone Encounter (Signed)
Spoke to pt . Pt states she was never contacted by specialty pharmacy to get her approval to mail Depo Lupron to [place]. I was told by pharmacy that under her plan, she did not need prior auth and that they would call her before mailing to [place]. Melody Comas A I will call them again.

## 2012-03-12 ENCOUNTER — Telehealth: Payer: Self-pay

## 2012-03-12 NOTE — Telephone Encounter (Signed)
LM for pt to please call [phone] to give verbal permission for [facility] to mail out the Lupron-Depot 11.25 mg prefilled syringe to our office. Customer service states that they have been trying to reach pt every day since the 18th but have not been able to get in touch w/ pt.  I asked pt to let me know when she has spoken to them. Melody Comas A

## 2012-03-18 ENCOUNTER — Telehealth: Payer: Self-pay

## 2012-03-18 NOTE — Telephone Encounter (Signed)
Called and LM again for pt to call me re: whether or not she has spoken to Specialty pharmacy to give ok to mail Lupron Depot 11.25 mg prefilled syringe to [place]. Melody Comas A

## 2012-03-20 ENCOUNTER — Telehealth: Payer: Self-pay

## 2012-03-20 NOTE — Telephone Encounter (Signed)
Spoke to pt's Mom, as I've been unable to reach pt re: trying to see if pt has contacted her ins. To give auth for them to ship med. Here. Melody Comas A

## 2012-03-25 ENCOUNTER — Telehealth: Payer: Self-pay

## 2012-03-25 NOTE — Telephone Encounter (Signed)
Spoke to pt today. She states that she will call insurance today to give them permission to mail Lupron Depot 11.25 mg syringe out to [place]. She will call me after she's done this so that I know it's been done. Melody Comas A

## 2012-03-26 ENCOUNTER — Telehealth: Payer: Self-pay

## 2012-03-26 NOTE — Telephone Encounter (Signed)
Note to close enc. Gill, Susan A  

## 2012-03-29 ENCOUNTER — Telehealth: Payer: Self-pay

## 2012-03-29 NOTE — Telephone Encounter (Signed)
LM again for pt to cb to let me know if she has given permission to Ins. To mail Depo Lupron out to [place]. Melody Comas A

## 2012-11-02 ENCOUNTER — Emergency Department (HOSPITAL_COMMUNITY)
Admission: EM | Admit: 2012-11-02 | Discharge: 2012-11-02 | Disposition: A | Discharge status: Home / Self Care | Payer: BC Managed Care – PPO | Attending: Emergency Medicine | Admitting: Emergency Medicine

## 2012-11-02 ENCOUNTER — Encounter (HOSPITAL_COMMUNITY): Payer: Self-pay | Admitting: Emergency Medicine

## 2012-11-02 DIAGNOSIS — M7989 Other specified soft tissue disorders: Secondary | ICD-10-CM | POA: Insufficient documentation

## 2012-11-02 DIAGNOSIS — M79605 Pain in left leg: Secondary | ICD-10-CM

## 2012-11-02 DIAGNOSIS — R000 Tachycardia, unspecified: Secondary | ICD-10-CM | POA: Insufficient documentation

## 2012-11-02 DIAGNOSIS — Z79899 Other long term (current) drug therapy: Secondary | ICD-10-CM | POA: Insufficient documentation

## 2012-11-02 DIAGNOSIS — Z8742 Personal history of other diseases of the female genital tract: Secondary | ICD-10-CM | POA: Insufficient documentation

## 2012-11-02 DIAGNOSIS — I10 Essential (primary) hypertension: Secondary | ICD-10-CM | POA: Insufficient documentation

## 2012-11-02 DIAGNOSIS — M79609 Pain in unspecified limb: Secondary | ICD-10-CM | POA: Insufficient documentation

## 2012-11-02 LAB — CBC
MCV: 90.6 fL (ref 78.0–100.0)
Platelets: 348 10*3/uL (ref 150–400)
RBC: 4.05 MIL/uL (ref 3.87–5.11)
RDW: 12.6 % (ref 11.5–15.5)
WBC: 5.3 10*3/uL (ref 4.0–10.5)

## 2012-11-02 LAB — BASIC METABOLIC PANEL
CO2: 22 mEq/L (ref 19–32)
Chloride: 103 mEq/L (ref 96–112)
Creatinine, Ser: 0.84 mg/dL (ref 0.50–1.10)
GFR calc Af Amer: >90 mL/min (ref >90)
Sodium: 139 mEq/L (ref 135–145)

## 2012-11-02 MED ORDER — HYDROCODONE-ACETAMINOPHEN 5-325 MG PO TABS
1.0000 | ORAL_TABLET | Freq: Once | ORAL | Status: AC
  Administered 2012-11-02: 1 via ORAL
  Filled 2012-11-02: qty 1

## 2012-11-02 MED ORDER — IBUPROFEN 200 MG PO TABS: 400.0000 mg | ORAL_TABLET | Freq: Three times a day (TID) | ORAL | Status: AC

## 2012-11-02 MED ORDER — DIAZEPAM 5 MG PO TABS: 5.0000 mg | ORAL_TABLET | Freq: Two times a day (BID) | ORAL | Status: AC

## 2012-11-02 MED ORDER — HYDROCODONE-ACETAMINOPHEN 5-325 MG PO TABS: 1.0000 | ORAL_TABLET | Freq: Three times a day (TID) | ORAL | Status: DC | PRN

## 2012-11-02 NOTE — ED Notes (Signed)
C/o worsening left posterior thigh pain x 2 weeks. Also reports some mild swelling. Denies injury, SOB, recent long travel

## 2012-11-02 NOTE — Progress Notes (Signed)
VASCULAR LAB PRELIMINARY  PRELIMINARY  PRELIMINARY  PRELIMINARY  Left lower extremity venous Doppler completed.    Preliminary report:  There is no DVT or SVT noted in the left lower extremity.  Erikka Follmer, RVT 11/02/2012, 6:00 PM

## 2012-11-02 NOTE — ED Provider Notes (Signed)
CSN: [id number]     Arrival date & time 11/02/12  1602 History   First MD Initiated Contact with Patient 11/02/12 1649     Chief Complaint  Patient presents with  . Leg Pain   (Consider location/radiation/quality/duration/timing/severity/associated sxs/prior Treatment) HPI Patient presents with concern of pain and swelling in her left leg. This episode began suddenly, at least one week ago.  Since onset symptoms of progressive.  There is focal pain at the mid posterior left thigh, radiates both caudally and cephalad. The pain is worse with motion, weightbearing.  Minimal relief with OTC medication.  Distal dysesthesia, but no distal weakness. No concurrent fevers, chills, chest pain, dyspnea. Patient does endorse mild chronic bilateral hand tingling. She notes a history of similar episode several months ago, following a long car trip She does not smoke, does not drink, denies use of oral contraceptives. Past Medical History  Diagnosis Date  . Hypertension   . Fibroids    Past Surgical History  Procedure Laterality Date  . Abdominal hysterectomy    . Cesarean section    . Tubal ligation     Family History  Problem Relation Age of Onset  . Cancer Mother     thyroid  . Diabetes Father   . Hypertension Maternal Grandmother   . Kidney disease Maternal Grandmother   . Diabetes Paternal Grandmother   . Diabetes Paternal Grandfather    History  Substance Use Topics  . Smoking status: Never Smoker   . Smokeless tobacco: Never Used  . Alcohol Use: No   OB History   Grav Para Term Preterm Abortions TAB SAB Ect Mult Living   2 2 2       2      Review of Systems  Constitutional:       Per HPI, otherwise negative  HENT:       Per HPI, otherwise negative  Respiratory:       Per HPI, otherwise negative  Cardiovascular:       Per HPI, otherwise negative  Gastrointestinal: Negative for vomiting.  Endocrine:       Negative aside from HPI  Genitourinary:       Neg aside from  HPI   Musculoskeletal:       Per HPI, otherwise negative  Skin: Negative.   Neurological: Negative for syncope.    Allergies  Codeine  Home Medications   Current Outpatient Rx  Name  Route  Sig  Dispense  Refill  . Azilsartan-Chlorthalidone (EDARBYCLOR) 40-12.5 MG TABS   Oral   Take 0.5 tablets by mouth daily.          Marland Kitchen ibuprofen (ADVIL,MOTRIN) 800 MG tablet   Oral   Take 1 tablet (800 mg total) by mouth every 8 (eight) hours as needed. For pain   30 tablet   0    BP 114/80  Pulse 102  Temp(Src) 98.5 F (36.9 C) (Oral)  Resp 18  SpO2 99% Physical Exam  Nursing note and vitals reviewed. Constitutional: She is oriented to person, place, and time. She appears well-developed and well-nourished. No distress.  HENT:  Head: Normocephalic and atraumatic.  Eyes: Conjunctivae and EOM are normal.  Cardiovascular: Regular rhythm.  Tachycardia present.   Pulmonary/Chest: Effort normal and breath sounds normal. No stridor. No respiratory distress.  Abdominal: She exhibits no distension.  Musculoskeletal: She exhibits no edema.  Strength, range of motion, are appropriate in both the hip and knee of the left leg.  The left leg is larger  than the right, both in the thigh and calf with no superficial changes.  Neurological: She is alert and oriented to person, place, and time. No cranial nerve deficit.  Skin: Skin is warm and dry.  Psychiatric: She has a normal mood and affect.    ED Course  Procedures (including critical care time) Labs Review Labs Reviewed  CBC  BASIC METABOLIC PANEL   Imaging Review No results found.  EKG Interpretation   None      6:38 PM Patient and son aware of all results.  Absent any new complaint patient is appropriate for discharge MDM  No diagnosis found. Patient presents with intermittent left leg pain.  Given the size discrepancy between the legs, her history of travel, ultrasound was performed through this is negative for DVT.  With  the description of pain along the posterior, neuropathy is now most likely etiology.  Patient is appropriate for discharge with further evaluation and management.  Absent distress, neurovascular status changes, she was discharged in stable condition.    Gerhard Munch, MD 11/02/12 [phone]

## 2012-11-02 NOTE — ED Notes (Signed)
Pt c/o left upper thigh pain that feels like a knot and tightness

## 2012-11-02 NOTE — ED Notes (Signed)
Doppler study at bedside

## 2013-01-14 ENCOUNTER — Other Ambulatory Visit: Payer: Self-pay | Admitting: Family Medicine

## 2013-01-14 DIAGNOSIS — M5410 Radiculopathy, site unspecified: Secondary | ICD-10-CM

## 2013-01-17 ENCOUNTER — Ambulatory Visit
Admission: RE | Admit: 2013-01-17 | Discharge: 2013-01-17 | Disposition: A | Discharge status: Home / Self Care | Payer: BC Managed Care – PPO | Source: Ambulatory Visit | Attending: Family Medicine | Admitting: Family Medicine

## 2013-01-17 DIAGNOSIS — M5410 Radiculopathy, site unspecified: Secondary | ICD-10-CM

## 2013-01-19 ENCOUNTER — Other Ambulatory Visit: Payer: BC Managed Care – PPO

## 2013-07-12 IMAGING — CR DG SHOULDER 2+V*R*
3 series · 3 of 3 positions shown · non-contrast
Comparison: None

CLINICAL DATA: Pain

RIGHT SHOULDER - 2+ VIEW

[w shoulder external right]
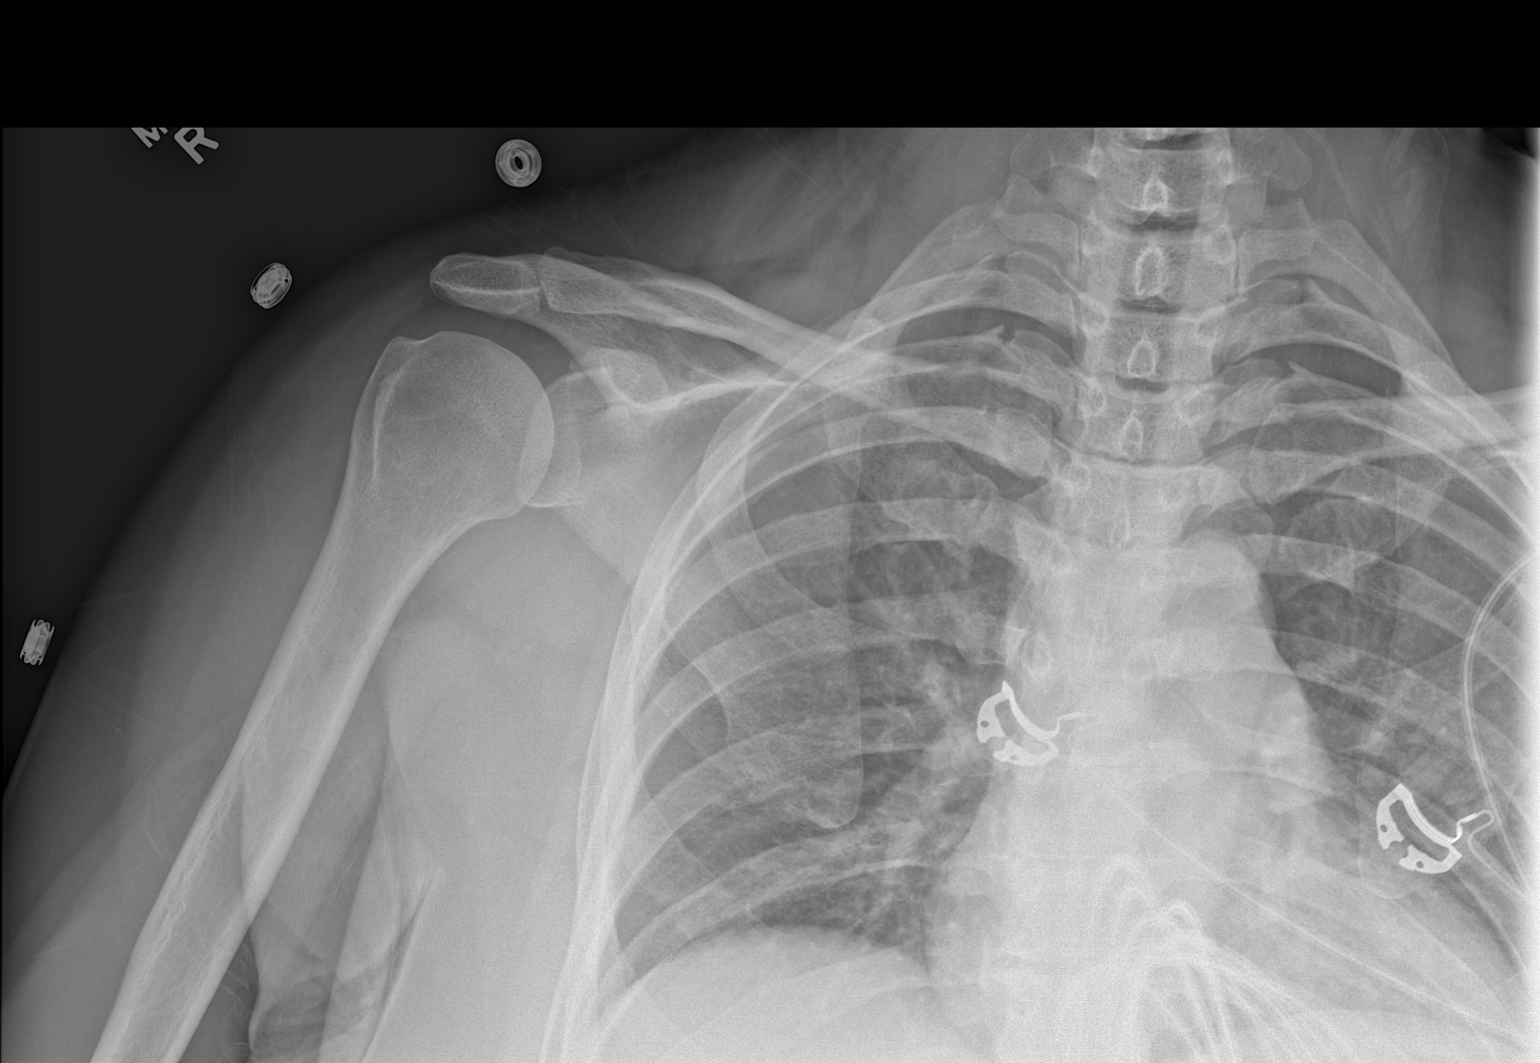

[w shoulder internal right]
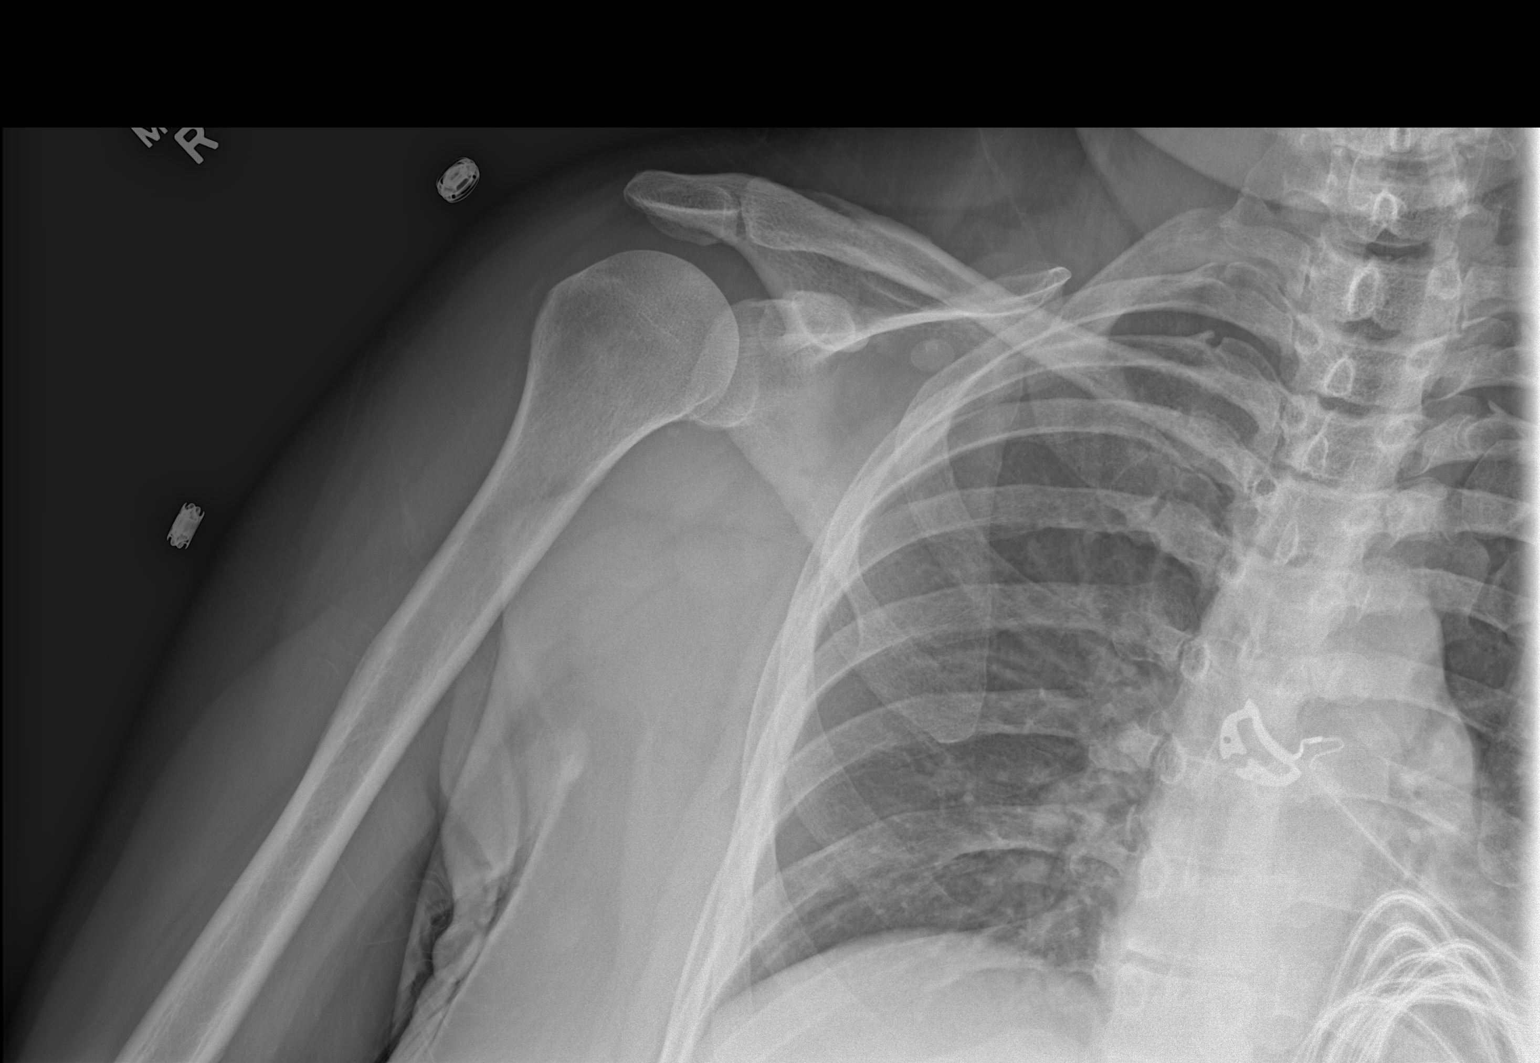

[w shoulder y-view right]
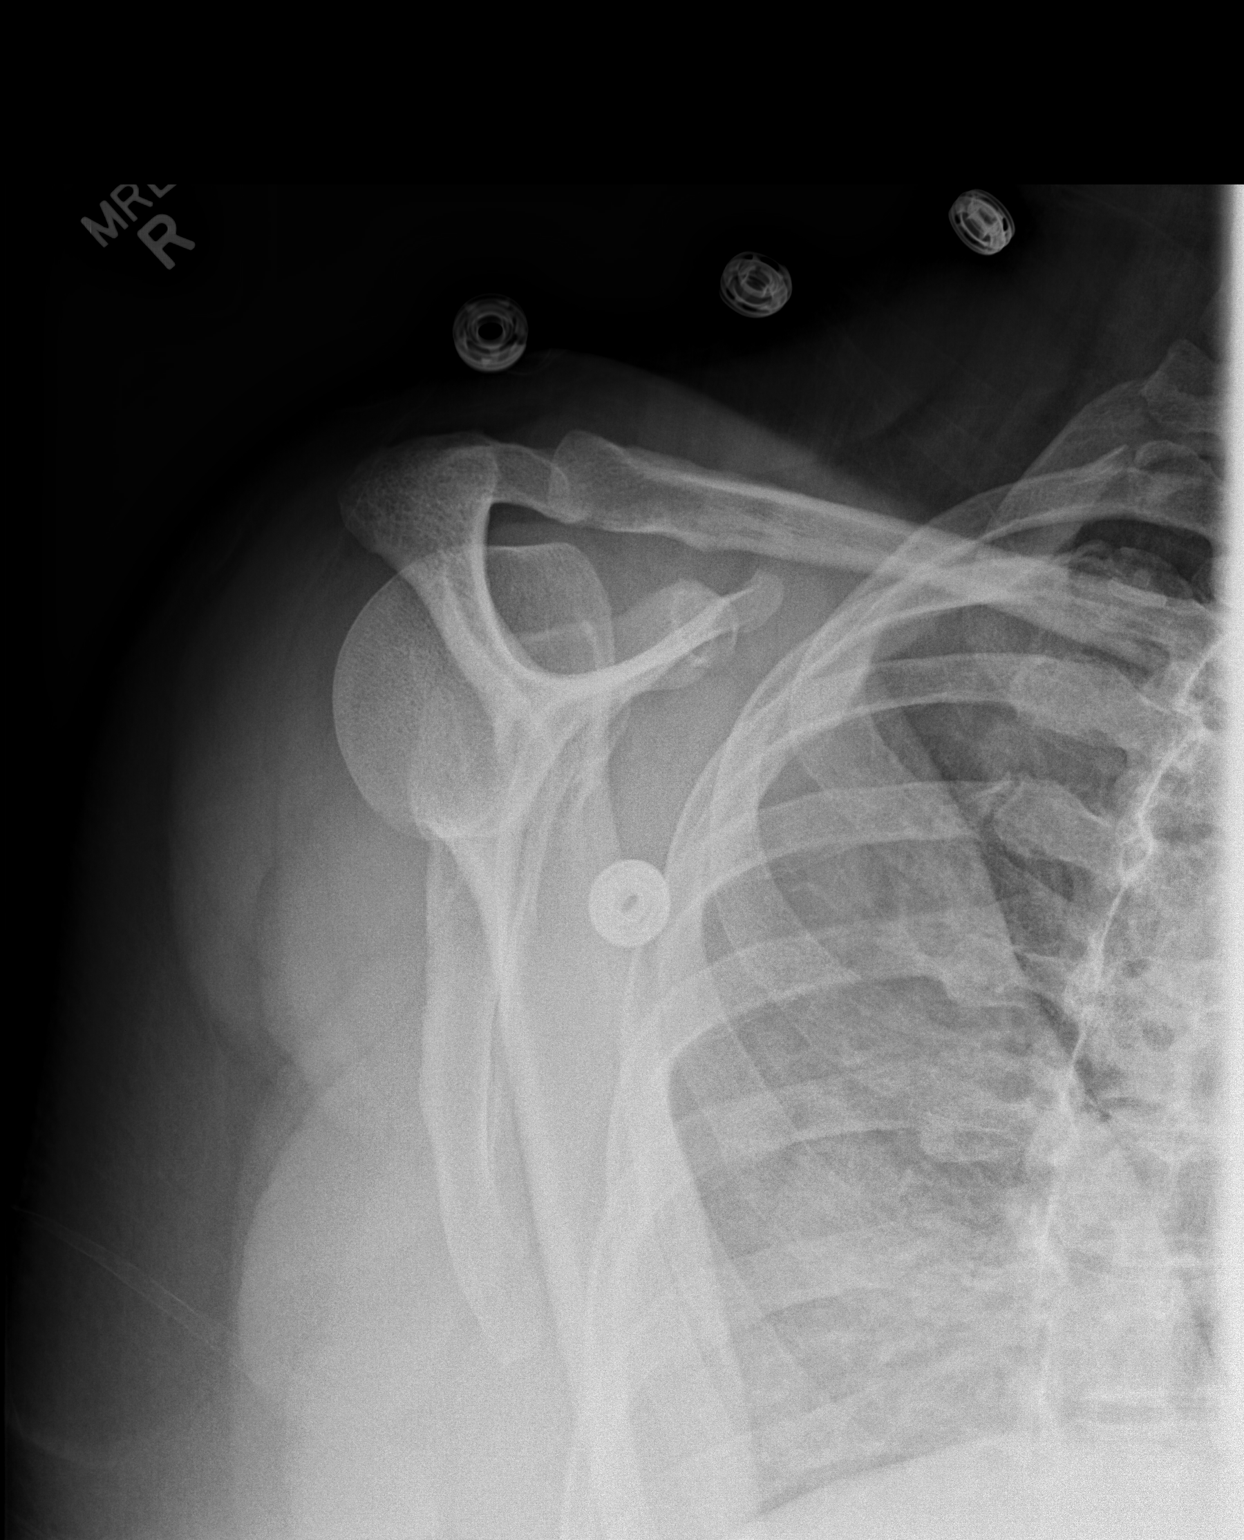

[3 of 3 positions shown; findings below may reference images not displayed]

FINDINGS: There is no evidence of fracture or dislocation.  There
is no evidence of arthropathy or other focal bone abnormality.
Soft tissues are unremarkable.
IMPRESSION: Negative examination.

## 2013-11-17 ENCOUNTER — Encounter (HOSPITAL_COMMUNITY): Payer: Self-pay | Admitting: Emergency Medicine

## 2015-06-13 ENCOUNTER — Encounter (HOSPITAL_BASED_OUTPATIENT_CLINIC_OR_DEPARTMENT_OTHER): Payer: Self-pay | Admitting: *Deleted

## 2015-06-13 ENCOUNTER — Emergency Department (HOSPITAL_BASED_OUTPATIENT_CLINIC_OR_DEPARTMENT_OTHER)
Admission: EM | Admit: 2015-06-13 | Discharge: 2015-06-13 | Disposition: A | Discharge status: Home / Self Care | Payer: BC Managed Care – PPO | Attending: Emergency Medicine | Admitting: Emergency Medicine

## 2015-06-13 DIAGNOSIS — J029 Acute pharyngitis, unspecified: Secondary | ICD-10-CM | POA: Diagnosis present

## 2015-06-13 DIAGNOSIS — I10 Essential (primary) hypertension: Secondary | ICD-10-CM | POA: Insufficient documentation

## 2015-06-13 DIAGNOSIS — Z79899 Other long term (current) drug therapy: Secondary | ICD-10-CM | POA: Insufficient documentation

## 2015-06-13 DIAGNOSIS — Z791 Long term (current) use of non-steroidal anti-inflammatories (NSAID): Secondary | ICD-10-CM | POA: Insufficient documentation

## 2015-06-13 DIAGNOSIS — B379 Candidiasis, unspecified: Secondary | ICD-10-CM | POA: Insufficient documentation

## 2015-06-13 HISTORY — DX: Sciatica, unspecified side: M54.30

## 2015-06-13 LAB — WET PREP, GENITAL
CLUE CELLS WET PREP: NONE SEEN
Sperm: NONE SEEN
TRICH WET PREP: NONE SEEN

## 2015-06-13 LAB — URINALYSIS, ROUTINE W REFLEX MICROSCOPIC
Bilirubin Urine: NEGATIVE
GLUCOSE, UA: NEGATIVE mg/dL
KETONES UR: NEGATIVE mg/dL
Leukocytes, UA: NEGATIVE
Nitrite: NEGATIVE
PH: 6.5 (ref 5.0–8.0)
PROTEIN: NEGATIVE mg/dL
Specific Gravity, Urine: 1.018 (ref 1.005–1.030)

## 2015-06-13 LAB — URINE MICROSCOPIC-ADD ON: WBC, UA: NONE SEEN WBC/hpf (ref 0–5)

## 2015-06-13 LAB — RAPID STREP SCREEN (MED CTR MEBANE ONLY): STREPTOCOCCUS, GROUP A SCREEN (DIRECT): NEGATIVE

## 2015-06-13 MED ORDER — FLUCONAZOLE 200 MG PO TABS: 200.0000 mg | ORAL_TABLET | Freq: Every day | ORAL | Status: AC

## 2015-06-13 MED ORDER — LIDOCAINE HCL (PF) 1 % IJ SOLN
INTRAMUSCULAR | Status: AC
  Administered 2015-06-13: 0.9 mL
  Filled 2015-06-13: qty 5

## 2015-06-13 MED ORDER — CEFTRIAXONE SODIUM 250 MG IJ SOLR
250.0000 mg | Freq: Once | INTRAMUSCULAR | Status: AC
  Administered 2015-06-13: 250 mg via INTRAMUSCULAR
  Filled 2015-06-13: qty 250

## 2015-06-13 MED ORDER — AZITHROMYCIN 250 MG PO TABS
1000.0000 mg | ORAL_TABLET | Freq: Once | ORAL | Status: AC
  Administered 2015-06-13: 1000 mg via ORAL
  Filled 2015-06-13: qty 4

## 2015-06-13 NOTE — Discharge Instructions (Signed)
Susan Gill,  Nice meeting you! Please follow-up with your primary care provider. You had blood in your urine which will need a repeat urinalysis in a few weeks (can be done by your family doctor). Return to the emergency department if you develop fevers, chills, increased abdominal pain, inability to keep foods down, new/worsening symptoms. Feel better soon!  S. Wendie Simmer, PA-C Pharyngitis Pharyngitis is redness, pain, and swelling (inflammation) of your pharynx.  CAUSES  Pharyngitis is usually caused by infection. Most of the time, these infections are from viruses (viral) and are part of a cold. However, sometimes pharyngitis is caused by bacteria (bacterial). Pharyngitis can also be caused by allergies. Viral pharyngitis may be spread from person to person by coughing, sneezing, and personal items or utensils (cups, forks, spoons, toothbrushes). Bacterial pharyngitis may be spread from person to person by more intimate contact, such as kissing.  SIGNS AND SYMPTOMS  Symptoms of pharyngitis include:   Sore throat.   Tiredness (fatigue).   Low-grade fever.   Headache.  Joint pain and muscle aches.  Skin rashes.  Swollen lymph nodes.  Plaque-like film on throat or tonsils (often seen with bacterial pharyngitis). DIAGNOSIS  Your health care provider will ask you questions about your illness and your symptoms. Your medical history, along with a physical exam, is often all that is needed to diagnose pharyngitis. Sometimes, a rapid strep test is done. Other lab tests may also be done, depending on the suspected cause.  TREATMENT  Viral pharyngitis will usually get better in 3-4 days without the use of medicine. Bacterial pharyngitis is treated with medicines that kill germs (antibiotics).  HOME CARE INSTRUCTIONS   Drink enough water and fluids to keep your urine clear or pale yellow.   Only take over-the-counter or prescription medicines as directed by your health  care provider:   If you are prescribed antibiotics, make sure you finish them even if you start to feel better.   Do not take aspirin.   Get lots of rest.   Gargle with 8 oz of salt water ( tsp of salt per 1 qt of water) as often as every 1-2 hours to soothe your throat.   Throat lozenges (if you are not at risk for choking) or sprays may be used to soothe your throat. SEEK MEDICAL CARE IF:   You have large, tender lumps in your neck.  You have a rash.  You cough up green, yellow-brown, or bloody spit. SEEK IMMEDIATE MEDICAL CARE IF:   Your neck becomes stiff.  You drool or are unable to swallow liquids.  You vomit or are unable to keep medicines or liquids down.  You have severe pain that does not go away with the use of recommended medicines.  You have trouble breathing (not caused by a stuffy nose). MAKE SURE YOU:   Understand these instructions.  Will watch your condition.  Will get help right away if you are not doing well or get worse.   This information is not intended to replace advice given to you by your health care provider. Make sure you discuss any questions you have with your health care provider.   Document Released: 01/02/2005 Document Revised: 10/23/2012 Document Reviewed: 09/09/2012 Elsevier Interactive Patient Education [employer].

## 2015-06-13 NOTE — ED Notes (Signed)
Pt reports sore throat, neck pain and fever (unsure how high, reports chills) that began this past Friday. Reports she completed a course of steroids for sciatica around Monday-- reports they were not effective for pain. Reports taking Naproxen with some relief. Denies cough, nasal congestion,n/v/d.

## 2015-06-13 NOTE — ED Provider Notes (Signed)
CSN: [id number]     Arrival date & time 06/13/15  0915 History   First MD Initiated Contact with Patient 06/13/15 [phone]     Chief Complaint  Patient presents with  . Sore Throat   HPI   Susan Gill is a 45 y.o. female PMH significant for HTN, sciatica presenting with sore throat, subjective fevers since Friday. 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 She completed a course of steroids for sciatica around Monday. She denies cough, nausea, vomiting, recent antibiotic use. After interviewing patient, patient reported to RN that she has had urinary frequency and would like to be checked for urinary tract infection. On reassessment, patient endorses vaginal burning. No discharge, itching, odor.   Past Medical History  Diagnosis Date  . Hypertension   . Fibroids   . Sciatica    Past Surgical History  Procedure Laterality Date  . Cesarean section    . Tubal ligation     Family History  Problem Relation Age of Onset  . Cancer Mother     thyroid  . Diabetes Father   . Hypertension Maternal Grandmother   . Kidney disease Maternal Grandmother   . Diabetes Paternal Grandmother   . Diabetes Paternal Grandfather    Social History  Substance Use Topics  . Smoking status: Never Smoker   . Smokeless tobacco: Never Used  . Alcohol Use: No   OB History    Gravida Para Term Preterm AB TAB SAB Ectopic Multiple Living   2 2 2       2      Review of Systems  Ten systems are reviewed and are negative for acute change except as noted in the HPI  Allergies  Codeine  Home Medications   Prior to Admission medications   Medication Sig Start Date End Date Taking? Authorizing Provider  Azilsartan-Chlorthalidone (EDARBYCLOR) 40-12.5 MG TABS Take 0.5 tablets by mouth daily.    Yes Historical Provider, MD  naproxen (NAPROSYN) 500 MG tablet Take 500 mg by mouth 2 (two) times daily with a meal.   Yes  Historical Provider, MD   BP 107/70 mmHg  Pulse 88  Temp(Src) 98.8 F (37.1 C) (Oral)  Resp 22  Ht 5\' 5"  (1.651 m)  Wt 113.399 kg  BMI 41.60 kg/m2  SpO2 98%  LMP 05/31/2015 Physical Exam  Constitutional: She appears well-developed and well-nourished. No distress.  Obese  HENT:  Head: Normocephalic and atraumatic.  Mouth/Throat: Oropharynx is clear and moist. No oropharyngeal exudate.  White patches on tongue  Eyes: Conjunctivae are normal. Pupils are equal, round, and reactive to light. Right eye exhibits no discharge. Left eye exhibits no discharge. No scleral icterus.  Neck: No tracheal deviation present.  Cardiovascular: Normal rate, regular rhythm, normal heart sounds and intact distal pulses.  Exam reveals no gallop and no friction rub.   No murmur heard. Pulmonary/Chest: Effort normal and breath sounds normal. No respiratory distress. She has no wheezes. She has no rales. She exhibits no tenderness.  Abdominal: Soft. Bowel sounds are normal. She exhibits no distension and no mass. There is tenderness. There is no rebound and no guarding.  Bilateral lower quadrants with mild, distractible tenderness  Genitourinary:  Chaperoned pelvic exam: normal external genitalia, vulva, vagina, cervix, uterus and adnexa. Yeast present in vaginal vault.    Musculoskeletal: She exhibits no edema.  Lymphadenopathy:    She has no cervical adenopathy.  Neurological: She is  alert. Coordination normal.  Skin: Skin is warm and dry. No rash noted. She is not diaphoretic. No erythema.  Psychiatric: She has a normal mood and affect. Her behavior is normal.  Nursing note and vitals reviewed.   ED Course  Procedures  Labs Review Labs Reviewed  WET PREP, GENITAL - Abnormal; Notable for the following:    Yeast Wet Prep HPF POC PRESENT (*)    WBC, Wet Prep HPF POC MODERATE (*)    All other components within normal limits  URINALYSIS, ROUTINE W REFLEX MICROSCOPIC (NOT AT [facility]) - Abnormal; Notable  for the following:    APPearance CLOUDY (*)    Hgb urine dipstick MODERATE (*)    All other components within normal limits  URINE MICROSCOPIC-ADD ON - Abnormal; Notable for the following:    Squamous Epithelial / LPF 6-30 (*)    Bacteria, UA MANY (*)    All other components within normal limits  RAPID STREP SCREEN (NOT AT [facility])  CULTURE, GROUP A STREP ([place])  GC/CHLAMYDIA PROBE AMP ([place]) NOT AT [facility]   MDM   Final diagnoses:  Candidiasis  Pharyngitis   Patient nontoxic-appearing, vital signs stable. Patient with candidiasis on wet prep. Given WBCs and lower quadrant tenderness on pelvic exam, will treat for STD empirically. Doubt appenditicitis, ovarian torsion, diverticulitis.  Urinalysis demonstrates hematuria. Encouraged patient to repeat analysis and a few weeks with primary care provider. Rapid strep negative. Given patient's symptoms and onset, most likely viral pharyngitis. No uvular edema or deviation, drooling, trismus. Will treat patient's candidiasis with Diflucan at discharge. Patient may be safely discharged at this time. Discussed return precautions. Follow-up with primary care provider in one week. Patient in understanding and agreement with the plan.  [place] Lions, PA-C 06/13/15 1143  Orlie Dakin, MD 06/13/15 [phone]

## 2015-06-15 LAB — GC/CHLAMYDIA PROBE AMP (~~LOC~~) NOT AT ARMC
CHLAMYDIA, DNA PROBE: NEGATIVE
NEISSERIA GONORRHEA: NEGATIVE

## 2015-06-16 LAB — CULTURE, GROUP A STREP (THRC)

## 2015-11-14 ENCOUNTER — Encounter (HOSPITAL_BASED_OUTPATIENT_CLINIC_OR_DEPARTMENT_OTHER): Payer: Self-pay | Admitting: *Deleted

## 2015-11-14 ENCOUNTER — Emergency Department (HOSPITAL_BASED_OUTPATIENT_CLINIC_OR_DEPARTMENT_OTHER): Payer: BC Managed Care – PPO

## 2015-11-14 ENCOUNTER — Emergency Department (HOSPITAL_BASED_OUTPATIENT_CLINIC_OR_DEPARTMENT_OTHER)
Admission: EM | Admit: 2015-11-14 | Discharge: 2015-11-14 | Disposition: A | Discharge status: Home / Self Care | Payer: BC Managed Care – PPO | Attending: Emergency Medicine | Admitting: Emergency Medicine

## 2015-11-14 DIAGNOSIS — I10 Essential (primary) hypertension: Secondary | ICD-10-CM | POA: Diagnosis not present

## 2015-11-14 DIAGNOSIS — Z79891 Long term (current) use of opiate analgesic: Secondary | ICD-10-CM | POA: Diagnosis not present

## 2015-11-14 DIAGNOSIS — R51 Headache: Secondary | ICD-10-CM

## 2015-11-14 DIAGNOSIS — Z79899 Other long term (current) drug therapy: Secondary | ICD-10-CM | POA: Diagnosis not present

## 2015-11-14 DIAGNOSIS — J0110 Acute frontal sinusitis, unspecified: Secondary | ICD-10-CM | POA: Diagnosis not present

## 2015-11-14 DIAGNOSIS — R519 Headache, unspecified: Secondary | ICD-10-CM

## 2015-11-14 LAB — COMPREHENSIVE METABOLIC PANEL
ALBUMIN: 4.1 g/dL (ref 3.5–5.0)
ALT: 37 U/L (ref 14–54)
AST: 30 U/L (ref 15–41)
Alkaline Phosphatase: 83 U/L (ref 38–126)
Anion gap: 7 (ref 5–15)
BUN: 13 mg/dL (ref 6–20)
CHLORIDE: 104 mmol/L (ref 101–111)
CO2: 25 mmol/L (ref 22–32)
Calcium: 9.4 mg/dL (ref 8.9–10.3)
Creatinine, Ser: 0.78 mg/dL (ref 0.44–1.00)
GFR calc Af Amer: >60 mL/min (ref >60)
GFR calc non Af Amer: >60 mL/min (ref >60)
GLUCOSE: 97 mg/dL (ref 65–99)
POTASSIUM: 3.9 mmol/L (ref 3.5–5.1)
SODIUM: 136 mmol/L (ref 135–145)
Total Bilirubin: 0.4 mg/dL (ref 0.3–1.2)
Total Protein: 7.2 g/dL (ref 6.5–8.1)

## 2015-11-14 LAB — CBC WITH DIFFERENTIAL/PLATELET
BASOS ABS: 0 10*3/uL (ref 0.0–0.1)
BASOS PCT: 1 %
EOS ABS: 0.2 10*3/uL (ref 0.0–0.7)
EOS PCT: 6 %
HCT: 40.5 % (ref 36.0–46.0)
Hemoglobin: 13.3 g/dL (ref 12.0–15.0)
LYMPHS PCT: 54 %
Lymphs Abs: 2.2 10*3/uL (ref 0.7–4.0)
MCH: 30.4 pg (ref 26.0–34.0)
MCHC: 32.8 g/dL (ref 30.0–36.0)
MCV: 92.7 fL (ref 78.0–100.0)
Monocytes Absolute: 0.5 10*3/uL (ref 0.1–1.0)
Monocytes Relative: 11 %
Neutro Abs: 1.2 10*3/uL — ABNORMAL LOW (ref 1.7–7.7)
Neutrophils Relative %: 29 %
PLATELETS: 346 10*3/uL (ref 150–400)
RBC: 4.37 MIL/uL (ref 3.87–5.11)
RDW: 12.8 % (ref 11.5–15.5)
WBC: 4 10*3/uL (ref 4.0–10.5)

## 2015-11-14 LAB — URINALYSIS, ROUTINE W REFLEX MICROSCOPIC
BILIRUBIN URINE: NEGATIVE
Glucose, UA: >1000 mg/dL — AB
Ketones, ur: NEGATIVE mg/dL
Leukocytes, UA: NEGATIVE
Nitrite: NEGATIVE
PROTEIN: NEGATIVE mg/dL
Specific Gravity, Urine: 1.02 (ref 1.005–1.030)
pH: 6 (ref 5.0–8.0)

## 2015-11-14 LAB — URINE MICROSCOPIC-ADD ON

## 2015-11-14 LAB — LIPASE, BLOOD: Lipase: 28 U/L (ref 11–51)

## 2015-11-14 LAB — PREGNANCY, URINE: PREG TEST UR: NEGATIVE

## 2015-11-14 LAB — I-STAT CG4 LACTIC ACID, ED: Lactic Acid, Venous: 0.95 mmol/L (ref 0.5–1.9)

## 2015-11-14 MED ORDER — AMOXICILLIN-POT CLAVULANATE 875-125 MG PO TABS: 1.0000 | ORAL_TABLET | Freq: Two times a day (BID) | ORAL | 0 refills | Status: AC

## 2015-11-14 MED ORDER — SODIUM CHLORIDE 0.9 % IV BOLUS (SEPSIS)
1000.0000 mL | Freq: Once | INTRAVENOUS | Status: AC
  Administered 2015-11-14: 1000 mL via INTRAVENOUS

## 2015-11-14 NOTE — ED Triage Notes (Signed)
Patient c/o chest congestion & sinus pressure that has grown worse since October 6.  She states that her Primary MD prescribed a Zpack and she finished that 3 weeks ago. She was also given an inhaler.

## 2015-11-14 NOTE — ED Provider Notes (Signed)
[facility] Provider Note   CSN: [id number] Arrival date & time: 11/14/15  [id number]     History   Chief Complaint Chief Complaint  Patient presents with  . Facial Pain    HPI Susan Gill is a 45 y.o. female with a past medical history significant for hypertension who presents with prolonged history of sinus congestion, rhinorrhea, cough, and chills. Patient reports that she has had approximately 3/2 weeks of rhinorrhea, congestion, and cough. She reports that she took a Z-Pak when this started but it has continued. She reports continued rhinorrhea, nasal pain, and headache. She denies any neurologic deficits including no double vision, blurry vision, or difficulty with eye movement. She denies any numbness, tingling, or weakness of extremities. She says that her cough has not been productive but has been persistent. She reports one episode of nausea and vomiting yesterday during an episode of coughing. She says she's having some back aching from her coughing. She says that she has had slight increase in urination frequency but denies dysuria. She denies constipation or diarrhea. She denies any abdominal pain, chest pain, or shortness of breath with this. She denies any other symptoms on arrival.      URI   This is a new problem. The current episode started more than 1 week ago. The problem has been gradually worsening. Associated symptoms include congestion, headaches, rhinorrhea, sinus pain, sneezing and cough. Pertinent negatives include no chest pain, no abdominal pain, no diarrhea, no nausea, no vomiting, no dysuria, no neck pain, no rash and no wheezing. She has tried nothing for the symptoms. The treatment provided no relief.    Past Medical History:  Diagnosis Date  . Fibroids   . Hypertension   . Sciatica     Patient Active Problem List   Diagnosis Date Noted  . Fibroids 02/09/2012  . Dysmenorrhea 02/09/2012  . Menorrhagia 02/09/2012    Past Surgical  History:  Procedure Laterality Date  . CESAREAN SECTION    . TUBAL LIGATION      OB History    Gravida Para Term Preterm AB Living   2 2 2     2    SAB TAB Ectopic Multiple Live Births                   Home Medications    Prior to Admission medications   Medication Sig Start Date End Date Taking? Authorizing Provider  Chlorphen-PE-Acetaminophen (NOREL AD PO) Take by mouth.   Yes Historical Provider, MD  Empagliflozin-Linagliptin (GLYXAMBI) 10-5 MG TABS Take by mouth.   Yes Historical Provider, MD  Azilsartan-Chlorthalidone (EDARBYCLOR) 40-12.5 MG TABS Take 0.5 tablets by mouth daily.     Historical Provider, MD  naproxen (NAPROSYN) 500 MG tablet Take 500 mg by mouth 2 (two) times daily with a meal.    Historical Provider, MD    Family History Family History  Problem Relation Age of Onset  . Cancer Mother     thyroid  . Diabetes Father   . Hypertension Maternal Grandmother   . Kidney disease Maternal Grandmother   . Diabetes Paternal Grandmother   . Diabetes Paternal Grandfather     Social History Social History  Substance Use Topics  . Smoking status: Never Smoker  . Smokeless tobacco: Never Used  . Alcohol use No     Allergies   Codeine; Other; and Peanut-containing drug products   Review of Systems Review of Systems  Constitutional: Positive for chills. Negative for fatigue and fever.  HENT: Positive for congestion, postnasal drip, rhinorrhea, sinus pressure and sneezing. Negative for facial swelling and nosebleeds.   Eyes: Negative for visual disturbance.  Respiratory: Positive for cough. Negative for chest tightness, shortness of breath and wheezing.   Cardiovascular: Negative for chest pain.  Gastrointestinal: Negative for abdominal pain, constipation, diarrhea, nausea and vomiting.  Genitourinary: Negative for dysuria, flank pain, frequency and pelvic pain.  Musculoskeletal: Negative for back pain, neck pain and neck stiffness.  Skin: Negative for  rash.  Neurological: Positive for headaches. Negative for seizures and light-headedness.  Psychiatric/Behavioral: Negative for agitation.  All other systems reviewed and are negative.    Physical Exam Updated Vital Signs BP 117/75 (BP Location: Left Arm)   Pulse 65   Temp 98 F (36.7 C) (Oral)   Resp 18   Ht 5\' 5"  (1.651 m)   Wt 250 lb (113.4 kg)   SpO2 100%   BMI 41.60 kg/m   Physical Exam  Constitutional: She appears well-developed and well-nourished. No distress.  HENT:  Head: Normocephalic and atraumatic.  Right Ear: External ear normal.  Left Ear: External ear normal.  Mouth/Throat: Oropharynx is clear and moist. No oropharyngeal exudate.  Eyes: Conjunctivae and EOM are normal. Pupils are equal, round, and reactive to light.  Neck: Normal range of motion. Neck supple.  Cardiovascular: Normal rate and regular rhythm.   No murmur heard. Pulmonary/Chest: Effort normal and breath sounds normal. No respiratory distress. She has no wheezes. She exhibits no tenderness.  Abdominal: Soft. There is no tenderness.  Musculoskeletal: She exhibits no edema or tenderness.  Neurological: She is alert. She exhibits normal muscle tone.  Skin: Skin is warm and dry. Capillary refill takes less than 2 seconds. No rash noted. No erythema.  Psychiatric: She has a normal mood and affect.  Nursing note and vitals reviewed.    ED Treatments / Results  Labs (all labs ordered are listed, but only abnormal results are displayed) Labs Reviewed  URINALYSIS, ROUTINE W REFLEX MICROSCOPIC (NOT AT [facility]) - Abnormal; Notable for the following:       Result Value   Glucose, UA >1000 (*)    Hgb urine dipstick TRACE (*)    All other components within normal limits  CBC WITH DIFFERENTIAL/PLATELET - Abnormal; Notable for the following:    Neutro Abs 1.2 (*)    All other components within normal limits  URINE MICROSCOPIC-ADD ON - Abnormal; Notable for the following:    Squamous Epithelial / LPF 0-5  (*)    Bacteria, UA MANY (*)    All other components within normal limits  PREGNANCY, URINE  COMPREHENSIVE METABOLIC PANEL  LIPASE, BLOOD  I-STAT CG4 LACTIC ACID, ED    EKG  EKG Interpretation None       Radiology Dg Chest 2 View  Result Date: 11/14/2015 CLINICAL DATA:  Shortness of breath EXAM: CHEST  2 VIEW COMPARISON:  03/04/2012 FINDINGS: Lungs are clear.  No pleural effusion or pneumothorax. The heart is normal in size. Degenerative changes of the visualized thoracolumbar spine. IMPRESSION: Normal chest radiographs. Electronically Signed   By: Julian Hy M.D.   On: 11/14/2015 09:33   Ct Maxillofacial Wo Contrast  Result Date: 11/14/2015 CLINICAL DATA:  Cough, congestion, sinus pressure x1 week EXAM: CT MAXILLOFACIAL WITHOUT CONTRAST TECHNIQUE: Multidetector CT imaging of the maxillofacial structures was performed. Multiplanar CT image reconstructions were also generated. A small metallic BB was placed on the right temple in order to reliably differentiate right from left. COMPARISON:  None.  FINDINGS: Osseous: No fracture or mandibular dislocation. No destructive process. Orbits: Negative. No traumatic or inflammatory finding. Sinuses: Opacification of the left frontal sinus (series 3/image 72). Visualized paranasal sinuses and mastoid air cells are otherwise clear. Soft tissues: Negative. Limited intracranial: No significant or unexpected finding. IMPRESSION: Left frontal sinus opacification. Visualized paranasal sinuses and mastoid air cells are otherwise clear. Electronically Signed   By: Julian Hy M.D.   On: 11/14/2015 09:32    Procedures Procedures (including critical care time)  Medications Ordered in ED Medications  sodium chloride 0.9 % bolus 1,000 mL (0 mLs Intravenous Stopped 11/14/15 1217)     Initial Impression / Assessment and Plan / ED Course  I have reviewed the triage vital signs and the nursing notes.  Pertinent labs & imaging results that  were available during my care of the patient were reviewed by me and considered in my medical decision making (see chart for details).  Clinical Course    Susan Gill is a 45 y.o. female with a past medical history significant for hypertension who presents with prolonged history of sinus congestion, rhinorrhea, cough, and chills.  History and exam are seen above.  Exam significant for nasal and frontal bone tenderness. Clear rhinorrhea present. Oral exam unremarkable. Mild lymphadenopathy present on neck. Neck not stiff and nontender. Extraocular motions intact. Lungs clear, abdomen nontender. Exam otherwise unremarkable including neurologic exam.   Given symptoms, suspect pneumonia versus sinusitis. Patient reports 3 episodes of pneumonia in the past and is concerned about this. Doubt meningitis given no nuchal rigidity or neck stiffness or neck pain. Patient had no neurologic deficits.  Given prolonged period of symptoms and nasal tenderness, patient will have imaging to look for abscess or severe sinusitis. X-ray will be obtained to look for pneumonia. Laboratory testing will be obtained to look for systemic infection or dehydration. Urine will be checked due to frequency.  Diagnostic workup revealed evidence of sinusitis. No pneumonia. No evidence of UTI.  Patient given prescription for Augmentin for sinusitis. Patient instructed to follow up with PCP as well as return for any new or worsening symptoms. Patient had no other questions or concerns and was discharged in good condition.   Final Clinical Impressions(s) / ED Diagnoses   Final diagnoses:  Face pain  Acute frontal sinusitis, recurrence not specified    New Prescriptions Discharge Medication List as of 11/14/2015 11:53 AM    START taking these medications   Details  amoxicillin-clavulanate (AUGMENTIN) 875-125 MG tablet Take 1 tablet by mouth every 12 (twelve) hours., Starting Sun 11/14/2015, Until Wed 11/24/2015,  Print       Clinical Impression: 1. Acute frontal sinusitis, recurrence not specified   2. Face pain     Disposition: Discharge  Condition: Good  I have discussed the results, Dx and Tx plan with the pt(& family if present). He/she/they expressed understanding and agree(s) with the plan. Discharge instructions discussed at great length. Strict return precautions discussed and pt &/or family have verbalized understanding of the instructions. No further questions at time of discharge.    Discharge Medication List as of 11/14/2015 11:53 AM    START taking these medications   Details  amoxicillin-clavulanate (AUGMENTIN) 875-125 MG tablet Take 1 tablet by mouth every 12 (twelve) hours., Starting Sun 11/14/2015, Until Wed 11/24/2015, Print        Follow Up: Lucianne Lei, MD [address] [place] [id number] [phone]  Schedule an appointment as soon as possible for a visit  [facility] [id number] [place] [id number]  If symptoms worsen     Courtney Paris, MD 11/15/15 1112

## 2016-02-21 ENCOUNTER — Other Ambulatory Visit: Payer: Self-pay | Admitting: Obstetrics and Gynecology

## 2016-04-18 ENCOUNTER — Other Ambulatory Visit: Payer: Self-pay | Admitting: Obstetrics and Gynecology

## 2016-04-26 NOTE — H&P (Signed)
Susan Gill is a 46 y.o.  female P: 2-0-1-2 who presents for hysterectomy because of menorrhagia and symptomatic uterine fibroids.  This patient has a long-standing history of menorrhagia and underwent an endometrial ablation in 2010.  Since that time her periods normalized to a 5 day flow with pad change 4 times a day and minimal cramping.  In December 2017, however,  the patient's period began and continued for 2 months.  During that time her bleeding was intermittently heavy so Provera was prescribed with good results.  A sono-hysterogram in February 2018 showed: anteverted uterus measuring: 7.61 x 10.66 x 14.8 cm, endometrium: 1.15 cm and an anterior, intramural fibroid measuring 7.3 cm; neither ovary was seen due to technical difficulty caused by  body habitus.  Saline infusion of endometrial cavity did not reveal any masses or submucosal component of the fibroid.  An endometrial biopsy done at the same time returned benign results.   She admits to urinary hesitancy, sensation of incomplete bladder emptying,  stress incontinence symptoms, lower back pain and sluggish bowel function attributed to iron supplementation.   She is not sexually active.  In March 2018 the patient's bleeding resumed with the passage of large clots, "gushes" of blood and several episodes in which she soiled her clothing.   She resumed Provera 10 mg every 6 hours which significantly slowed her bleeding but did not stop it.  A review of both medical and surgical management options were given to the patient however, given the severity of her symptoms and its protracted nature she has decided to proceed with definitive therapy in the form of hysterectomy.   Past Medical History  OB History: G: 3;  P: 2-0-1-2;  C-section: 1991 & 1997  GYN History: menarche: 6    LMP: 04/07/2016    Contracepton abstinence  The patient reports a past history of: herpes.  Denies history of abnormal PAP smear.   Last PAP smear: 12/2015 normal PAP  and negative HPV  Medical History: Hypertension, Asthma and Anemia  Surgical History:  2010 Endometrial Ablation Denies problems with anesthesia but developed post anesthesia pneumonia in 1991 after C-section. No  history of blood transfusions  Family History: Hypertension, Diabetes Mellitus, Heart Disease, Breast Cancer, Thyroid Cancer and  Renal Disease  Social History:  Single and employed as a Pharmacist, hospital;  Denies tobacco or alcohol use  Medicines:  Provera 10 mg every 6 hours prn Vitamin D daily Edarbyclor 40/25   daily Iron  twice a day  Allergies  Allergen Reactions  . Codeine Hypertension    Heart races  . Other Itching    All nuts  . Peanut-Containing Drug Products Itching   Denies sensitivity to  shellfish, soy, latex or adhesives.  Tree nuts cause tongue swelling  ROS: Admits to reading glasses, permanent retainer in lower jaw, bilateral knee swelling and occasional stress incontinence symptoms;   Denies headache, vision changes, nasal congestion, dysphagia, tinnitus, dizziness, hoarseness, cough,  chest pain, shortness of breath, nausea, vomiting, diarrhea,constipation,  urinary frequency, urgency  dysuria, hematuria, vaginitis symptoms, pelvic pain, easy bruising,  myalgias, arthralgias, skin rashes, unexplained weight loss and except as is mentioned in the history of present illness, patient's review of systems is otherwise negative.  Physical Exam  Bp: 104/60    P: 88 bpm    Temperature:  98.8 degree F orally    Weight:  274 lbs.  Height: 5\' 5"   BMI: 45.6  Neck: supple without masses or thyromegaly Lungs: clear to auscultation Heart:  regular rate and rhythm Abdomen: soft, non-tender and no organomegaly Pelvic:EGBUS- wnl; vagina-normal rugae; uterus-enlarged but exam limited by body habitus, cervix-deep within pelvis and appears normal without lesions  adnexae-no tenderness or masses Extremities:  no clubbing, cyanosis or edema   Assesment:  Menorrhagia                        Symptomatic Uterine Fibroids   Disposition:  A discussion was held with patient regarding the indication for her procedure(s) along with the risks, which include but are not limited to: reaction to anesthesia, damage to adjacent organs, infection,  excessive bleeding, supra-cervical hysterectomy that would necessitate continued PAP smears, possible growth of fibroid on cervical stump and possible bleeding from the stump. . The patient verbalized understanding of these risks and has consented to proceed with a Total Laparoscopic Hysterectomy, Possible Laparoscopically Assisted Vaginal Hysterectomy, Possible Total Abdominal Hysterectomy, Bilateral Salpingectomy and Possible Cystoscopy at [facility] on May 09, 2016  CSN# [id number]   Kalia Vahey J. Florene Glen, PA-C  for Dr Delice Lesch

## 2016-05-02 NOTE — Patient Instructions (Signed)
Your procedure is scheduled on:  Tuesday, May 09, 2016  Enter through the [employer] [facility] at:  [place] up the phone at the desk and dial [phone].  Call this number if you have problems the morning of surgery: [phone].  Remember: Do NOT eat food:  After Midnight Monday  Do NOT drink clear liquids after:  8:30 AM day of surgery  Take these medicines the morning of surgery with a SIP OF WATER:  Azilsartan  Stop ALL herbal medications at this time  Do NOT smoke the day of surgery.  Do NOT wear jewelry (body piercing), metal hair clips/bobby pins, make-up, or nail polish. Do NOT wear lotions, powders, or perfumes.  You may wear deodorant. Do NOT shave for 48 hours prior to surgery. Do NOT bring valuables to the hospital. Contacts, dentures, or bridgework may not be worn into surgery.  Leave suitcase in car.  After surgery it may be brought to your room.  For patients admitted to the hospital, checkout time is 11:00 AM the day of discharge.  Bring a copy of your healthcare power of attorney and living will documents.

## 2016-05-03 ENCOUNTER — Encounter (HOSPITAL_COMMUNITY): Payer: Self-pay

## 2016-05-03 ENCOUNTER — Other Ambulatory Visit: Payer: Self-pay

## 2016-05-03 ENCOUNTER — Encounter (HOSPITAL_COMMUNITY)
Admission: RE | Admit: 2016-05-03 | Discharge: 2016-05-03 | Disposition: A | Discharge status: Home / Self Care | Payer: BC Managed Care – PPO | Source: Ambulatory Visit | Attending: Obstetrics and Gynecology | Admitting: Obstetrics and Gynecology

## 2016-05-03 DIAGNOSIS — Z01812 Encounter for preprocedural laboratory examination: Secondary | ICD-10-CM | POA: Diagnosis present

## 2016-05-03 DIAGNOSIS — Z01810 Encounter for preprocedural cardiovascular examination: Secondary | ICD-10-CM | POA: Diagnosis not present

## 2016-05-03 HISTORY — DX: Unspecified asthma, uncomplicated: J45.909

## 2016-05-03 HISTORY — DX: Anesthesia of skin: R20.0

## 2016-05-03 HISTORY — DX: Pneumonia, unspecified organism: J18.9

## 2016-05-03 LAB — BASIC METABOLIC PANEL
Anion gap: 11 (ref 5–15)
BUN: 12 mg/dL (ref 6–20)
CALCIUM: 9.2 mg/dL (ref 8.9–10.3)
CO2: 20 mmol/L — ABNORMAL LOW (ref 22–32)
CREATININE: 0.71 mg/dL (ref 0.44–1.00)
Chloride: 104 mmol/L (ref 101–111)
GFR calc Af Amer: >60 mL/min (ref >60)
GFR calc non Af Amer: >60 mL/min (ref >60)
Glucose, Bld: 76 mg/dL (ref 65–99)
POTASSIUM: 4 mmol/L (ref 3.5–5.1)
SODIUM: 135 mmol/L (ref 135–145)

## 2016-05-03 LAB — CBC
HCT: 34.9 % — ABNORMAL LOW (ref 36.0–46.0)
Hemoglobin: 11.7 g/dL — ABNORMAL LOW (ref 12.0–15.0)
MCH: 31 pg (ref 26.0–34.0)
MCHC: 33.5 g/dL (ref 30.0–36.0)
MCV: 92.6 fL (ref 78.0–100.0)
PLATELETS: 395 10*3/uL (ref 150–400)
RBC: 3.77 MIL/uL — AB (ref 3.87–5.11)
RDW: 13.3 % (ref 11.5–15.5)
WBC: 9.5 10*3/uL (ref 4.0–10.5)

## 2016-05-09 ENCOUNTER — Ambulatory Visit (HOSPITAL_COMMUNITY): Payer: BC Managed Care – PPO | Admitting: Anesthesiology

## 2016-05-09 ENCOUNTER — Inpatient Hospital Stay (HOSPITAL_COMMUNITY)
Admission: RE | Admit: 2016-05-09 | Discharge: 2016-05-12 | DRG: 742 | Disposition: A | Discharge status: Home / Self Care | Payer: BC Managed Care – PPO | Source: Ambulatory Visit | Attending: Obstetrics and Gynecology | Admitting: Obstetrics and Gynecology

## 2016-05-09 ENCOUNTER — Encounter (HOSPITAL_COMMUNITY): Payer: Self-pay | Admitting: Anesthesiology

## 2016-05-09 ENCOUNTER — Encounter (HOSPITAL_COMMUNITY)
Admission: RE | Disposition: A | Discharge status: Home / Self Care | Payer: Self-pay | Source: Ambulatory Visit | Attending: Obstetrics and Gynecology

## 2016-05-09 DIAGNOSIS — D649 Anemia, unspecified: Secondary | ICD-10-CM | POA: Diagnosis present

## 2016-05-09 DIAGNOSIS — Z6841 Body Mass Index (BMI) 40.0 and over, adult: Secondary | ICD-10-CM | POA: Diagnosis not present

## 2016-05-09 DIAGNOSIS — J45909 Unspecified asthma, uncomplicated: Secondary | ICD-10-CM | POA: Diagnosis present

## 2016-05-09 DIAGNOSIS — D251 Intramural leiomyoma of uterus: Principal | ICD-10-CM | POA: Diagnosis present

## 2016-05-09 DIAGNOSIS — Z5331 Laparoscopic surgical procedure converted to open procedure: Secondary | ICD-10-CM | POA: Diagnosis not present

## 2016-05-09 DIAGNOSIS — N920 Excessive and frequent menstruation with regular cycle: Secondary | ICD-10-CM | POA: Diagnosis present

## 2016-05-09 DIAGNOSIS — I10 Essential (primary) hypertension: Secondary | ICD-10-CM | POA: Diagnosis present

## 2016-05-09 DIAGNOSIS — N946 Dysmenorrhea, unspecified: Secondary | ICD-10-CM | POA: Diagnosis present

## 2016-05-09 DIAGNOSIS — M79609 Pain in unspecified limb: Secondary | ICD-10-CM | POA: Diagnosis not present

## 2016-05-09 HISTORY — PX: HYSTERECTOMY ABDOMINAL WITH SALPINGECTOMY: SHX6725

## 2016-05-09 HISTORY — PX: CYSTOSCOPY: SHX5120

## 2016-05-09 HISTORY — PX: LAPAROSCOPIC VAGINAL HYSTERECTOMY WITH SALPINGECTOMY: SHX6680

## 2016-05-09 LAB — PREGNANCY, URINE: PREG TEST UR: NEGATIVE

## 2016-05-09 SURGERY — HYSTERECTOMY, VAGINAL, LAPAROSCOPY-ASSISTED, WITH SALPINGECTOMY
Anesthesia: General | Site: Bladder

## 2016-05-09 MED ORDER — MIDAZOLAM HCL 2 MG/2ML IJ SOLN
INTRAMUSCULAR | Status: AC
  Filled 2016-05-09: qty 2

## 2016-05-09 MED ORDER — HYDROMORPHONE HCL 1 MG/ML IJ SOLN
0.2500 mg | INTRAMUSCULAR | Status: DC | PRN
  Administered 2016-05-09 (×3): 0.5 mg via INTRAVENOUS

## 2016-05-09 MED ORDER — FENTANYL CITRATE (PF) 100 MCG/2ML IJ SOLN
INTRAMUSCULAR | Status: AC
  Filled 2016-05-09: qty 2

## 2016-05-09 MED ORDER — ONDANSETRON HCL 4 MG/2ML IJ SOLN
INTRAMUSCULAR | Status: AC
  Filled 2016-05-09: qty 2

## 2016-05-09 MED ORDER — IBUPROFEN 600 MG PO TABS
600.0000 mg | ORAL_TABLET | Freq: Four times a day (QID) | ORAL | Status: DC | PRN
  Administered 2016-05-11 – 2016-05-12 (×3): 600 mg via ORAL
  Filled 2016-05-09 (×3): qty 1

## 2016-05-09 MED ORDER — SODIUM CHLORIDE 0.9 % IJ SOLN
INTRAMUSCULAR | Status: AC
  Filled 2016-05-09: qty 10

## 2016-05-09 MED ORDER — MIDAZOLAM HCL 2 MG/2ML IJ SOLN
0.5000 mg | Freq: Once | INTRAMUSCULAR | Status: AC
  Administered 2016-05-09: 0.5 mg via INTRAVENOUS

## 2016-05-09 MED ORDER — NALOXONE HCL 0.4 MG/ML IJ SOLN: 0.4000 mg | INTRAMUSCULAR | Status: DC | PRN

## 2016-05-09 MED ORDER — PHENYLEPHRINE 40 MCG/ML (10ML) SYRINGE FOR IV PUSH (FOR BLOOD PRESSURE SUPPORT)
PREFILLED_SYRINGE | INTRAVENOUS | Status: AC
  Filled 2016-05-09: qty 10

## 2016-05-09 MED ORDER — HYDROMORPHONE 1 MG/ML IV SOLN
INTRAVENOUS | Status: DC
  Administered 2016-05-09: 1.4 mg via INTRAVENOUS
  Administered 2016-05-09: 0.5 mg via INTRAVENOUS
  Administered 2016-05-10: 0.3 mg via INTRAVENOUS
  Administered 2016-05-10: 1.2 mg via INTRAVENOUS
  Filled 2016-05-09: qty 25

## 2016-05-09 MED ORDER — PROPOFOL 10 MG/ML IV BOLUS
INTRAVENOUS | Status: AC
Start: 2016-05-09 — End: 2016-05-09
  Filled 2016-05-09: qty 20

## 2016-05-09 MED ORDER — PHENYLEPHRINE HCL 10 MG/ML IJ SOLN
INTRAMUSCULAR | Status: DC | PRN
  Administered 2016-05-09 (×2): 80 ug via INTRAVENOUS
  Administered 2016-05-09: 40 ug via INTRAVENOUS
  Administered 2016-05-09 (×5): 80 ug via INTRAVENOUS
  Administered 2016-05-09: 40 ug via INTRAVENOUS
  Administered 2016-05-09 (×2): 80 ug via INTRAVENOUS

## 2016-05-09 MED ORDER — DEXAMETHASONE SODIUM PHOSPHATE 10 MG/ML IJ SOLN
INTRAMUSCULAR | Status: AC
  Filled 2016-05-09: qty 1

## 2016-05-09 MED ORDER — HYDROMORPHONE HCL 1 MG/ML IJ SOLN
INTRAMUSCULAR | Status: DC | PRN
  Administered 2016-05-09: 1 mg via INTRAVENOUS

## 2016-05-09 MED ORDER — PHENYLEPHRINE 40 MCG/ML (10ML) SYRINGE FOR IV PUSH (FOR BLOOD PRESSURE SUPPORT)
PREFILLED_SYRINGE | INTRAVENOUS | Status: AC
  Filled 2016-05-09: qty 30

## 2016-05-09 MED ORDER — ONDANSETRON HCL 4 MG/2ML IJ SOLN
INTRAMUSCULAR | Status: DC | PRN
  Administered 2016-05-09: 4 mg via INTRAVENOUS

## 2016-05-09 MED ORDER — HYDROMORPHONE HCL 1 MG/ML IJ SOLN
INTRAMUSCULAR | Status: AC
  Filled 2016-05-09: qty 1

## 2016-05-09 MED ORDER — METOCLOPRAMIDE HCL 5 MG/ML IJ SOLN: 10.0000 mg | Freq: Once | INTRAMUSCULAR | Status: DC | PRN

## 2016-05-09 MED ORDER — ESTRADIOL 0.1 MG/GM VA CREA
TOPICAL_CREAM | VAGINAL | Status: AC
  Filled 2016-05-09: qty 42.5

## 2016-05-09 MED ORDER — LACTATED RINGERS IV SOLN
INTRAVENOUS | Status: DC
  Administered 2016-05-09: 1000 mL via INTRAVENOUS
  Administered 2016-05-09: 13:00:00 via INTRAVENOUS

## 2016-05-09 MED ORDER — GLYCOPYRROLATE 0.2 MG/ML IJ SOLN
INTRAMUSCULAR | Status: AC
  Filled 2016-05-09: qty 1

## 2016-05-09 MED ORDER — ONDANSETRON HCL 4 MG/2ML IJ SOLN: 4.0000 mg | Freq: Four times a day (QID) | INTRAMUSCULAR | Status: DC | PRN

## 2016-05-09 MED ORDER — SUGAMMADEX SODIUM 200 MG/2ML IV SOLN
INTRAVENOUS | Status: DC | PRN
  Administered 2016-05-09: 200 mg via INTRAVENOUS

## 2016-05-09 MED ORDER — VASOPRESSIN 20 UNIT/ML IV SOLN
INTRAVENOUS | Status: AC
  Filled 2016-05-09: qty 1

## 2016-05-09 MED ORDER — DIPHENHYDRAMINE HCL 12.5 MG/5ML PO ELIX: 12.5000 mg | ORAL_SOLUTION | Freq: Four times a day (QID) | ORAL | Status: DC | PRN

## 2016-05-09 MED ORDER — LACTATED RINGERS IV SOLN
INTRAVENOUS | Status: DC
  Administered 2016-05-10 (×3): via INTRAVENOUS

## 2016-05-09 MED ORDER — ROCURONIUM BROMIDE 100 MG/10ML IV SOLN
INTRAVENOUS | Status: AC
  Filled 2016-05-09: qty 1

## 2016-05-09 MED ORDER — FENTANYL CITRATE (PF) 250 MCG/5ML IJ SOLN
INTRAMUSCULAR | Status: AC
  Filled 2016-05-09: qty 5

## 2016-05-09 MED ORDER — MENTHOL 3 MG MT LOZG
1.0000 | LOZENGE | OROMUCOSAL | Status: DC | PRN
  Administered 2016-05-10: 3 mg via ORAL
  Filled 2016-05-09: qty 9

## 2016-05-09 MED ORDER — EPHEDRINE 5 MG/ML INJ
INTRAVENOUS | Status: AC
  Filled 2016-05-09: qty 10

## 2016-05-09 MED ORDER — ROCURONIUM BROMIDE 100 MG/10ML IV SOLN
INTRAVENOUS | Status: DC | PRN
  Administered 2016-05-09: 10 mg via INTRAVENOUS
  Administered 2016-05-09: 60 mg via INTRAVENOUS
  Administered 2016-05-09: 20 mg via INTRAVENOUS
  Administered 2016-05-09: 10 mg via INTRAVENOUS

## 2016-05-09 MED ORDER — SCOPOLAMINE 1 MG/3DAYS TD PT72
MEDICATED_PATCH | TRANSDERMAL | Status: AC
  Administered 2016-05-09: 1.5 mg via TRANSDERMAL
  Filled 2016-05-09: qty 1

## 2016-05-09 MED ORDER — DIPHENHYDRAMINE HCL 50 MG/ML IJ SOLN
12.5000 mg | Freq: Four times a day (QID) | INTRAMUSCULAR | Status: DC | PRN
  Administered 2016-05-10: 12.5 mg via INTRAVENOUS
  Filled 2016-05-09: qty 1

## 2016-05-09 MED ORDER — SODIUM CHLORIDE 0.9 % IJ SOLN
INTRAMUSCULAR | Status: AC
  Filled 2016-05-09: qty 50

## 2016-05-09 MED ORDER — PHENYLEPHRINE 8 MG IN D5W 100 ML (0.08MG/ML) PREMIX OPTIME
INJECTION | INTRAVENOUS | Status: DC | PRN
  Administered 2016-05-09: 60 ug/min via INTRAVENOUS

## 2016-05-09 MED ORDER — FENTANYL CITRATE (PF) 100 MCG/2ML IJ SOLN
INTRAMUSCULAR | Status: DC | PRN
  Administered 2016-05-09 (×2): 50 ug via INTRAVENOUS
  Administered 2016-05-09 (×2): 100 ug via INTRAVENOUS
  Administered 2016-05-09: 50 ug via INTRAVENOUS
  Administered 2016-05-09: 100 ug via INTRAVENOUS

## 2016-05-09 MED ORDER — MEPERIDINE HCL 25 MG/ML IJ SOLN: 6.2500 mg | INTRAMUSCULAR | Status: DC | PRN

## 2016-05-09 MED ORDER — DEXAMETHASONE SODIUM PHOSPHATE 10 MG/ML IJ SOLN
INTRAMUSCULAR | Status: DC | PRN
  Administered 2016-05-09: 10 mg via INTRAVENOUS

## 2016-05-09 MED ORDER — DOCUSATE SODIUM 100 MG PO CAPS
100.0000 mg | ORAL_CAPSULE | Freq: Two times a day (BID) | ORAL | Status: DC
  Administered 2016-05-10 – 2016-05-12 (×5): 100 mg via ORAL
  Filled 2016-05-09 (×5): qty 1

## 2016-05-09 MED ORDER — EPHEDRINE SULFATE 50 MG/ML IJ SOLN
INTRAMUSCULAR | Status: DC | PRN
  Administered 2016-05-09: 10 mg via INTRAVENOUS
  Administered 2016-05-09: 5 mg via INTRAVENOUS
  Administered 2016-05-09 (×2): 10 mg via INTRAVENOUS

## 2016-05-09 MED ORDER — SUGAMMADEX SODIUM 200 MG/2ML IV SOLN
INTRAVENOUS | Status: AC
  Filled 2016-05-09: qty 2

## 2016-05-09 MED ORDER — PROPOFOL 10 MG/ML IV BOLUS
INTRAVENOUS | Status: DC | PRN
  Administered 2016-05-09: 100 mg via INTRAVENOUS
  Administered 2016-05-09: 200 mg via INTRAVENOUS

## 2016-05-09 MED ORDER — SODIUM CHLORIDE 0.9% FLUSH: 9.0000 mL | INTRAVENOUS | Status: DC | PRN

## 2016-05-09 MED ORDER — LIDOCAINE HCL (CARDIAC) 20 MG/ML IV SOLN
INTRAVENOUS | Status: AC
  Filled 2016-05-09: qty 5

## 2016-05-09 MED ORDER — LIDOCAINE HCL (CARDIAC) 20 MG/ML IV SOLN
INTRAVENOUS | Status: DC | PRN
  Administered 2016-05-09: 80 mg via INTRAVENOUS

## 2016-05-09 MED ORDER — ONDANSETRON HCL 4 MG PO TABS: 4.0000 mg | ORAL_TABLET | Freq: Three times a day (TID) | ORAL | Status: DC | PRN

## 2016-05-09 MED ORDER — BUPIVACAINE HCL (PF) 0.25 % IJ SOLN
INTRAMUSCULAR | Status: AC
  Filled 2016-05-09: qty 30

## 2016-05-09 MED ORDER — FERROUS SULFATE 325 (65 FE) MG PO TABS
325.0000 mg | ORAL_TABLET | ORAL | Status: DC
  Administered 2016-05-11: 325 mg via ORAL
  Filled 2016-05-09: qty 1

## 2016-05-09 MED ORDER — MIDAZOLAM HCL 2 MG/2ML IJ SOLN
INTRAMUSCULAR | Status: DC | PRN
  Administered 2016-05-09: 1 mg via INTRAVENOUS

## 2016-05-09 MED ORDER — AZILSARTAN-CHLORTHALIDONE 40-12.5 MG PO TABS
0.5000 | ORAL_TABLET | Freq: Every day | ORAL | Status: DC
  Administered 2016-05-10: 0.5 via ORAL

## 2016-05-09 MED ORDER — OXYCODONE-ACETAMINOPHEN 5-325 MG PO TABS
1.0000 | ORAL_TABLET | ORAL | Status: DC | PRN
  Administered 2016-05-10 – 2016-05-11 (×5): 1 via ORAL
  Filled 2016-05-09: qty 1
  Filled 2016-05-09: qty 2
  Filled 2016-05-09 (×3): qty 1

## 2016-05-09 MED ORDER — KETOROLAC TROMETHAMINE 30 MG/ML IJ SOLN
30.0000 mg | Freq: Four times a day (QID) | INTRAMUSCULAR | Status: AC
  Administered 2016-05-10 (×4): 30 mg via INTRAVENOUS
  Filled 2016-05-09 (×4): qty 1

## 2016-05-09 MED ORDER — CEFAZOLIN SODIUM 10 G IJ SOLR
3.0000 g | Freq: Once | INTRAMUSCULAR | Status: AC
  Administered 2016-05-09: 3 g via INTRAVENOUS
  Filled 2016-05-09: qty 3000

## 2016-05-09 MED ORDER — SCOPOLAMINE 1 MG/3DAYS TD PT72
1.0000 | MEDICATED_PATCH | Freq: Once | TRANSDERMAL | Status: DC
  Administered 2016-05-09: 1.5 mg via TRANSDERMAL

## 2016-05-09 MED ORDER — KETOROLAC TROMETHAMINE 30 MG/ML IJ SOLN
INTRAMUSCULAR | Status: AC
  Filled 2016-05-09: qty 1

## 2016-05-09 SURGICAL SUPPLY — 71 items
ADH SKN CLS APL DERMABOND .7 (GAUZE/BANDAGES/DRESSINGS) ×2
BARRIER ADHS 3X4 INTERCEED (GAUZE/BANDAGES/DRESSINGS) IMPLANT
BRR ADH 4X3 ABS CNTRL BYND (GAUZE/BANDAGES/DRESSINGS)
CABLE HIGH FREQUENCY MONO STRZ (ELECTRODE) IMPLANT
CLOTH BEACON ORANGE TIMEOUT ST (SAFETY) ×4 IMPLANT
CONT PATH 16OZ SNAP LID 3702 (MISCELLANEOUS) ×4 IMPLANT
COUNTER NEEDLE 1200 MAGNETIC (NEEDLE) ×2 IMPLANT
COVER BACK TABLE 60X90IN (DRAPES) ×6 IMPLANT
DECANTER SPIKE VIAL GLASS SM (MISCELLANEOUS) ×4 IMPLANT
DERMABOND ADVANCED (GAUZE/BANDAGES/DRESSINGS) ×2
DERMABOND ADVANCED .7 DNX12 (GAUZE/BANDAGES/DRESSINGS) ×2 IMPLANT
DRAPE ROBOTICS STRL (DRAPES) ×2 IMPLANT
DRAPE SHEET LG 3/4 BI-LAMINATE (DRAPES) ×4 IMPLANT
DRSG OPSITE POSTOP 3X4 (GAUZE/BANDAGES/DRESSINGS) ×2 IMPLANT
DRSG OPSITE POSTOP 4X10 (GAUZE/BANDAGES/DRESSINGS) ×2 IMPLANT
DURAPREP 26ML APPLICATOR (WOUND CARE) ×4 IMPLANT
ELECT REM PT RETURN 9FT ADLT (ELECTROSURGICAL) ×4
ELECTRODE REM PT RTRN 9FT ADLT (ELECTROSURGICAL) ×2 IMPLANT
FILTER SMOKE EVAC LAPAROSHD (FILTER) ×4 IMPLANT
FORCEPS CUTTING 33CM 5MM (CUTTING FORCEPS) ×2 IMPLANT
GAUZE PACKING 2X5 YD STRL (GAUZE/BANDAGES/DRESSINGS) IMPLANT
GAUZE SPONGE 4X4 16PLY XRAY LF (GAUZE/BANDAGES/DRESSINGS) ×2 IMPLANT
GLOVE BIO SURGEON STRL SZ7.5 (GLOVE) ×8 IMPLANT
GLOVE BIOGEL PI IND STRL 7.0 (GLOVE) ×4 IMPLANT
GLOVE BIOGEL PI IND STRL 7.5 (GLOVE) ×6 IMPLANT
GLOVE BIOGEL PI INDICATOR 7.0 (GLOVE) ×4
GLOVE BIOGEL PI INDICATOR 7.5 (GLOVE) ×6
HEMOSTAT SURGICEL 4X8 (HEMOSTASIS) ×2 IMPLANT
LEGGING LITHOTOMY PAIR STRL (DRAPES) ×4 IMPLANT
NDL MAYO CATGUT SZ4 TPR NDL (NEEDLE) IMPLANT
NEEDLE INSUFFLATION 120MM (ENDOMECHANICALS) ×4 IMPLANT
NEEDLE MAYO CATGUT SZ4 (NEEDLE) IMPLANT
NS IRRIG 1000ML POUR BTL (IV SOLUTION) ×4 IMPLANT
PACK LAVH (CUSTOM PROCEDURE TRAY) ×4 IMPLANT
PACK ROBOTIC GOWN (GOWN DISPOSABLE) ×4 IMPLANT
PACK TRENDGUARD 450 HYBRID PRO (MISCELLANEOUS) IMPLANT
PACK TRENDGUARD 600 HYBRD PROC (MISCELLANEOUS) IMPLANT
PENCIL BUTTON HOLSTER BLD 10FT (ELECTRODE) ×2 IMPLANT
PROTECTOR NERVE ULNAR (MISCELLANEOUS) ×8 IMPLANT
SCISSORS LAP 5X35 DISP (ENDOMECHANICALS) IMPLANT
SET CYSTO W/LG BORE CLAMP LF (SET/KITS/TRAYS/PACK) ×4 IMPLANT
SET IRRIG TUBING LAPAROSCOPIC (IRRIGATION / IRRIGATOR) ×2 IMPLANT
SHEARS HARMONIC ACE PLUS 36CM (ENDOMECHANICALS) IMPLANT
SLEEVE XCEL OPT CAN 5 100 (ENDOMECHANICALS) ×4 IMPLANT
SOLUTION ELECTROLUBE (MISCELLANEOUS) ×2 IMPLANT
SPONGE LAP 18X18 X RAY DECT (DISPOSABLE) ×4 IMPLANT
SUT CHROMIC 2 0 CT 1 (SUTURE) ×2 IMPLANT
SUT CHROMIC 2 0 SH (SUTURE) IMPLANT
SUT MON AB 4-0 PS1 27 (SUTURE) ×4 IMPLANT
SUT VIC AB 0 CT1 18XCR BRD8 (SUTURE) ×4 IMPLANT
SUT VIC AB 0 CT1 27 (SUTURE) ×4
SUT VIC AB 0 CT1 27XBRD ANBCTR (SUTURE) ×2 IMPLANT
SUT VIC AB 0 CT1 36 (SUTURE) ×4 IMPLANT
SUT VIC AB 0 CT1 8-18 (SUTURE) ×12
SUT VIC AB 2-0 SH 27 (SUTURE) ×4
SUT VIC AB 2-0 SH 27XBRD (SUTURE) IMPLANT
SUT VICRYL 0 TIES 12 18 (SUTURE) ×4 IMPLANT
SUT VICRYL 0 UR6 27IN ABS (SUTURE) ×2 IMPLANT
SYR TB 1ML LUER SLIP (SYRINGE) ×2 IMPLANT
TIP UTERINE 6.7X8CM BLUE DISP (MISCELLANEOUS) ×2 IMPLANT
TOWEL OR 17X24 6PK STRL BLUE (TOWEL DISPOSABLE) ×8 IMPLANT
TRAY FOLEY CATH SILVER 14FR (SET/KITS/TRAYS/PACK) ×4 IMPLANT
TRENDGUARD 450 HYBRID PRO PACK (MISCELLANEOUS)
TRENDGUARD 600 HYBRID PROC PK (MISCELLANEOUS) ×4
TROCAR BALLN 12MMX100 BLUNT (TROCAR) IMPLANT
TROCAR XCEL NON-BLD 11X100MML (ENDOMECHANICALS) ×2 IMPLANT
TROCAR XCEL NON-BLD 5MMX100MML (ENDOMECHANICALS) ×4 IMPLANT
TUBING NON-CON 1/4 X 20 CONN (TUBING) ×1 IMPLANT
TUBING NON-CON 1/4 X 20' CONN (TUBING) ×1
WARMER LAPAROSCOPE (MISCELLANEOUS) ×4 IMPLANT
YANKAUER SUCT BULB TIP NO VENT (SUCTIONS) ×2 IMPLANT

## 2016-05-09 NOTE — Transfer of Care (Signed)
Immediate Anesthesia Transfer of Care Note  Patient: Susan Gill  Procedure(s) Performed: Procedure(s): ATTEMPTED TOTAL LAPAROSCOPIC  HYSTERECTOMY WITH SALPINGECTOMY (Bilateral) HYSTERECTOMY ABDOMINAL WITH SALPINGECTOMY (Bilateral) CYSTOSCOPY (N/A)  Patient Location: PACU  Anesthesia Type:General  Level of Consciousness: awake and alert   Airway & Oxygen Therapy: Patient Spontanous Breathing and Patient connected to nasal cannula oxygen  Post-op Assessment: Report given to RN and Post -op Vital signs reviewed and stable  Post vital signs: Reviewed and stable  Last Vitals:  Vitals:   05/09/16 1223  BP: 126/77  Pulse: 76  Resp: 16  Temp: 36.7 C    Last Pain:  Vitals:   05/09/16 1223  TempSrc: Oral  PainSc: 5       Patients Stated Pain Goal: 4 (30/05/11 0211)  Complications: No apparent anesthesia complications

## 2016-05-09 NOTE — Anesthesia Procedure Notes (Signed)
Procedure Name: Intubation Date/Time: 05/09/2016 1:36 PM Performed by: Georgeanne Nim Pre-anesthesia Checklist: Patient identified, Patient being monitored, Timeout performed, Emergency Drugs available and Suction available Patient Re-evaluated:Patient Re-evaluated prior to inductionOxygen Delivery Method: Circle system utilized Preoxygenation: Pre-oxygenation with 100% oxygen Intubation Type: IV induction Ventilation: Mask ventilation without difficulty Laryngoscope Size: Mac Grade View: Grade II Tube size: 7.0 mm Number of attempts: 1 Airway Equipment and Method: Patient positioned with wedge pillow and Stylet Placement Confirmation: ETT inserted through vocal cords under direct vision,  breath sounds checked- equal and bilateral,  positive ETCO2 and CO2 detector Secured at: 21 cm Tube secured with: Tape Dental Injury: Teeth and Oropharynx as per pre-operative assessment

## 2016-05-09 NOTE — Op Note (Addendum)
Preop Diagnosis: 1.Menometrorrhagia  2.Symptomatic Fibroids  Postop Diagnosis: 1.Menometrorrhagia  2.Symptomatic Fibroids  Procedure: 1.DX LAPAROSCOPY 2.TOTAL ABDOMINAL HYSTERECTOMY 3.BILATERAL SALPINGECTOMY 4.CYSTOSCOPY  Anesthesia: General   Anesthesiologist: Josephine Igo, MD   Attending: Everett Graff, MD   Assistant: Floydene Flock, PA-C  Findings: LARGE FIBROID UTERUS 586G.  NORMAL APPEARING BILATERAL OVARIES.  Pathology: UTERUS, CERVIX AND TUBES  Fluids: 2800 CC  UOP: 500 CC  EBL: 097 cc  Complications: None  Procedure: The patient was taken to the operating room after r/b/a discussed and consent signed and witnessed.  The patient was placed in the dorsal lithotomy position and prepped and draped in the normal sterile fashion after time out performed.  The patient received intravenous antibiotics and had sequential compression devices applied to her lower extremities while in the preoperative area.  A Foley catheter was inserted into the bladder and attached to constant drainage.   Attention was turned to the perineum and speculum placed in the vagina.  Uterus sounded to 10cm.  A size 8 probe was used for intrauterine manipulator with KOH ring.  The Koh ring was sutured to cervix due to inability to stay.  The tip balloon had been inflated with 5cc and occluder inflated with 60cc saline.  I regowned and gloved after removing speculum and attention turned to the abdomen.  An umbilical incision was made and verese needle passed into the intraabdominal cavity and pneuomperitoneum achieved.  49mm trocar was then placed and laparoscope.  Inspection noted an anterior fibroid that obliterated the anterior cul-de-sac. Decision was made to convert to abdominal hysterectomy.  The fascia was repaired with 0 vicryl and 3-0 monocryl subcuticular stitch placed.  The KOH ring was removed.  One of the stitches tore through the cervix/vagina upon removal and the others cut out.  A  Pfannensteil skin incision was made. This incision was taken down to the fascia using electrocautery with care given to maintain good hemostasis. The fascia was incised in the midline and the fascial incision was then extended bilaterally using electrocautery without difficulty. The fascia was then dissected off the underlying rectus muscles using blunt and sharp dissection. The rectus muscles were split bluntly in the midline and the peritoneum entered sharply without complication. This peritoneal incision was then extended superiorly and inferiorly with care given to prevent bowel or bladder injury. Attention was then turned to the pelvis. The uterus at this point was noted to be mobilized and was delivered up out of the abdomen.  The round ligaments on each side were clamped, suture ligated with 0 Vicryl, and transected with electrocautery allowing entry into the broad ligament. Of note, all sutures used in this procedure are 0 Vicryl unless otherwise noted.   A hole was created in the clear portion of the posterior broad ligament and the utero-ovarian ligament was clamped on the patient's left side then cut, and doubly suture ligated with good hemostasis.  This procedure was repeated in an identical fashion on the opposite side.  Bilaterally the fallopian tubes were elevated with a babcock clamped beneath the tubes, excised and ligated x2.  A bladder flap was then created.  The bladder was then bluntly dissected off the lower uterine segment and cervix with good hemostasis noted. The uterine arteries were then skeletonized bilaterally and then clamped, cut, and doubly suture ligated with care given to prevent ureteral injury.  The uterus was then amputated across the lower uterine segment.  The uterosacral ligaments were then clamped, cut, and ligated bilaterally.  Finally, the  cardinal ligaments were clamped, cut, and suture ligated bilaterally.  Acutely curved clamps were placed across the vagina just under  the cervix, and the specimen was amputated and sent to pathology. The vaginal cuff angles were closed with Heaney stiches with care given to incorporate the uterosacral-cardinal ligament pedicles on both sides. The middle of the vaginal cuff was closed with a series of interrupted figure-of-eight sutures with care given to incorporate the anterior pubocervical fascia and the posterior rectovaginal fascia.   The pelvis was irrigated and hemostasis was reconfirmed at all pedicles and along the pelvic sidewall.   Surgicel was applied to the cuff and over the ovaries bilaterally.  All laparotomy sponges and instruments were removed from the abdomen. The peritoneum was closed with a running stitch of 2-0 chromic, and the fascia was closed in a running fashion 0 vicryl. The subcutaneous layer was reapproximated with 2-0 plain gut. The skin was closed with a 3-0 monocryl subcuticular stitch. A large graves speculum was placed in the pt's vagina and there was small amount of oozing at the cuff which was repaired with a running stitch of 0 vicryl.  Dermabond was applied to the umbilicus.  Cystoscopy was performed efflux was noted from both ureters.  Sponge, lap, needle, and instrument counts were correct times two. The patient was taken to the recovery area awake, extubated and in stable condition.

## 2016-05-09 NOTE — Anesthesia Preprocedure Evaluation (Addendum)
Anesthesia Evaluation  Patient identified by MRN, date of birth, ID band Patient awake    Reviewed: Allergy & Precautions, NPO status , Patient's Chart, lab work & pertinent test results  Airway Mallampati: III  TM Distance: >3 FB Neck ROM: Full    Dental no notable dental hx. (+) Teeth Intact Lower permanent retainer:   Pulmonary asthma , pneumonia, resolved,    Pulmonary exam normal breath sounds clear to auscultation       Cardiovascular hypertension, Pt. on medications Normal cardiovascular exam Rhythm:Regular Rate:Normal     Neuro/Psych Sciatica  Neuromuscular disease negative psych ROS   GI/Hepatic negative GI ROS, Neg liver ROS,   Endo/Other  Morbid obesity  Renal/GU negative Renal ROS  negative genitourinary   Musculoskeletal negative musculoskeletal ROS (+)   Abdominal   Peds  Hematology  (+) anemia ,   Anesthesia Other Findings   Reproductive/Obstetrics Uterine fibroids Dysmenorrhea Menometrorrhagia menorrhagia                            Anesthesia Physical Anesthesia Plan  ASA: III  Anesthesia Plan: General   Post-op Pain Management:    Induction: Intravenous  Airway Management Planned: Oral ETT  Additional Equipment:   Intra-op Plan:   Post-operative Plan: Extubation in OR  Informed Consent: I have reviewed the patients History and Physical, chart, labs and discussed the procedure including the risks, benefits and alternatives for the proposed anesthesia with the patient or authorized representative who has indicated his/her understanding and acceptance.   Dental advisory given  Plan Discussed with: Anesthesiologist, CRNA and Surgeon  Anesthesia Plan Comments:         Anesthesia Quick Evaluation

## 2016-05-09 NOTE — Anesthesia Postprocedure Evaluation (Signed)
Anesthesia Post Note  Patient: Susan Gill  Procedure(s) Performed: Procedure(s) (LRB): ATTEMPTED TOTAL LAPAROSCOPIC  HYSTERECTOMY WITH SALPINGECTOMY (Bilateral) HYSTERECTOMY ABDOMINAL WITH SALPINGECTOMY (Bilateral) CYSTOSCOPY (N/A)  Patient location during evaluation: PACU Anesthesia Type: General Level of consciousness: awake and alert Pain management: pain level controlled Vital Signs Assessment: post-procedure vital signs reviewed and stable Respiratory status: spontaneous breathing, nonlabored ventilation, respiratory function stable and patient connected to nasal cannula oxygen Cardiovascular status: blood pressure returned to baseline and stable Postop Assessment: no signs of nausea or vomiting Anesthetic complications: no        Last Vitals:  Vitals:   05/09/16 1830 05/09/16 1845  BP: (!) 107/58 (!) 97/51  Pulse: 86 86  Resp: 14 12  Temp:      Last Pain:  Vitals:   05/09/16 1900  TempSrc:   PainSc: Asleep   Pain Goal: Patients Stated Pain Goal: 4 (05/09/16 1900)               Lynda Rainwater

## 2016-05-09 NOTE — Interval H&P Note (Signed)
History and Physical Interval Note:  05/09/2016 1:13 PM  Susan Gill  has presented today for surgery, with the diagnosis of Menometrorrhagia   The various methods of treatment have been discussed with the patient and family. After consideration of risks, benefits and other options for treatment, the patient has consented to  Procedure(s): TOTAL LAPAROSCOPIC HYSTERECTOMY. POSSIBLE LAPAROSCOPIC ASSISTED VAGINAL HYSTERECTOMY, POSSIBLE TOTAL ABDOMINAL HYSTERECTOMY WITH BILATERAL SALPINGECTOMY AND POSSIBLE CYSTOSCOPY as a surgical intervention .  The patient's history has been reviewed, patient examined, no change in status, stable for surgery.  I have reviewed the patient's chart and labs.  Questions were answered to the patient's satisfaction.     Delice Lesch

## 2016-05-10 ENCOUNTER — Encounter (HOSPITAL_COMMUNITY): Payer: Self-pay | Admitting: Obstetrics and Gynecology

## 2016-05-10 LAB — BASIC METABOLIC PANEL
ANION GAP: 9 (ref 5–15)
BUN: 9 mg/dL (ref 6–20)
CALCIUM: 8.8 mg/dL — AB (ref 8.9–10.3)
CO2: 22 mmol/L (ref 22–32)
Chloride: 105 mmol/L (ref 101–111)
Creatinine, Ser: 0.77 mg/dL (ref 0.44–1.00)
GFR calc Af Amer: >60 mL/min (ref >60)
GFR calc non Af Amer: >60 mL/min (ref >60)
GLUCOSE: 139 mg/dL — AB (ref 65–99)
Potassium: 4.1 mmol/L (ref 3.5–5.1)
Sodium: 136 mmol/L (ref 135–145)

## 2016-05-10 LAB — CBC
HEMATOCRIT: 27.7 % — AB (ref 36.0–46.0)
HEMATOCRIT: 27.1 % — AB (ref 36.0–46.0)
HEMOGLOBIN: 9 g/dL — AB (ref 12.0–15.0)
Hemoglobin: 9.5 g/dL — ABNORMAL LOW (ref 12.0–15.0)
MCH: 31.7 pg (ref 26.0–34.0)
MCH: 31.3 pg (ref 26.0–34.0)
MCHC: 34.3 g/dL (ref 30.0–36.0)
MCHC: 33.2 g/dL (ref 30.0–36.0)
MCV: 92.3 fL (ref 78.0–100.0)
MCV: 94.1 fL (ref 78.0–100.0)
PLATELETS: 356 10*3/uL (ref 150–400)
Platelets: 337 10*3/uL (ref 150–400)
RBC: 3 MIL/uL — ABNORMAL LOW (ref 3.87–5.11)
RBC: 2.88 MIL/uL — ABNORMAL LOW (ref 3.87–5.11)
RDW: 13.4 % (ref 11.5–15.5)
RDW: 13.7 % (ref 11.5–15.5)
WBC: 12.6 10*3/uL — AB (ref 4.0–10.5)
WBC: 13.5 10*3/uL — ABNORMAL HIGH (ref 4.0–10.5)

## 2016-05-10 MED ORDER — LACTATED RINGERS IV BOLUS (SEPSIS)
500.0000 mL | Freq: Once | INTRAVENOUS | Status: AC
  Administered 2016-05-10: 500 mL via INTRAVENOUS

## 2016-05-10 NOTE — Progress Notes (Signed)
Susan Gill is a47 y.o.  [id number]  Post Op Date # 1: Attempted Laparoscopic Hysterectomy/Abdominal Hysterectomy/Bilateral Salpingectomy/Cystoscopy/ LOA  Subjective: Patient is Doing well postoperatively. Patient has Pain is controlled with current analgesics. Medications being used: prescription NSAID's including Ketorolac 30 mg IV (started this a.m.) and narcotic analgesics including PCA Dilaudid. Tolerating crackers, juice and water and has ambulated in the halls without dizziness.   Objective: Vital signs in last 24 hours: Temp:  [98 F (36.7 C)-98.9 F (37.2 C)] 98.9 F (37.2 C) (04/25 0430) Pulse Rate:  [74-104] 74 (04/25 0430) Resp:  [12-25] 24 (04/25 0515) BP: (96-126)/(51-82) 101/56 (04/25 0430) SpO2:  [95 %-100 %] 97 % (04/25 0515) Weight:  [272 lb (123.4 kg)] 272 lb (123.4 kg) (04/24 1223)  Intake/Output from previous day: 04/24 0701 - 04/25 0700 In: 3220 [P.O.:120; I.V.:2800] Out: 2250 [Urine:1900] Intake/Output this shift: No intake/output data recorded.  Recent Labs Lab 05/03/16 1615 05/10/16 0533  WBC 9.5 12.6*  HGB 11.7* 9.5*  HCT 34.9* 27.7*  PLT 395 356     Recent Labs Lab 05/03/16 1615 05/10/16 0533  NA 135 136  K 4.0 4.1  CL 104 105  CO2 20* 22  BUN 12 9  CREATININE 0.71 0.77  CALCIUM 9.2 8.8*  GLUCOSE 76 139*    EXAM: General: alert, cooperative and no distress Resp: clear to auscultation bilaterally Cardio: regular rate and rhythm, S1, S2 normal, no murmur, click, rub or gallop GI: Bowel sounds present;  dressings are clean/dry and intact. Extremities: Homans sign is negative, no sign of DVT and SCD hose in place and functioning-no calf tenderness. Vaginal Bleeding: none   Assessment: s/p Procedure(s): ATTEMPTED TOTAL LAPAROSCOPIC  HYSTERECTOMY WITH SALPINGECTOMY HYSTERECTOMY ABDOMINAL WITH SALPINGECTOMY CYSTOSCOPY: stable and progressing well  Plan: Advance diet Encourage ambulation Advance to PO  medication Routine care.  LOS: 1 day    POWELL,ELMIRA, PA-C 05/10/2016 7:39 AM

## 2016-05-11 LAB — CBC
HEMATOCRIT: 23.8 % — AB (ref 36.0–46.0)
HEMOGLOBIN: 8 g/dL — AB (ref 12.0–15.0)
MCH: 31.4 pg (ref 26.0–34.0)
MCHC: 33.6 g/dL (ref 30.0–36.0)
MCV: 93.3 fL (ref 78.0–100.0)
Platelets: 311 10*3/uL (ref 150–400)
RBC: 2.55 MIL/uL — AB (ref 3.87–5.11)
RDW: 13.7 % (ref 11.5–15.5)
WBC: 10.6 10*3/uL — AB (ref 4.0–10.5)

## 2016-05-11 MED ORDER — OXYCODONE-ACETAMINOPHEN 5-325 MG PO TABS: ORAL_TABLET | ORAL | 0 refills | Status: DC

## 2016-05-11 MED ORDER — HYDROMORPHONE HCL 2 MG PO TABS
2.0000 mg | ORAL_TABLET | Freq: Four times a day (QID) | ORAL | Status: DC | PRN
  Administered 2016-05-11 – 2016-05-12 (×3): 2 mg via ORAL
  Filled 2016-05-11 (×3): qty 1

## 2016-05-11 MED ORDER — IBUPROFEN 600 MG PO TABS: ORAL_TABLET | ORAL | 0 refills | Status: DC

## 2016-05-11 NOTE — Discharge Instructions (Signed)
Call [facility] @ [phone] if:  You have a temperature greater than or equal to 100.4 degrees Farenheit orally You have pain that is not made better by the pain medication given and taken as directed You have excessive bleeding or problems urinating  Take Colace (Docusate Sodium/Stool Softener) 100 mg 2-3 times daily while taking narcotic pain medicine to avoid constipation or until bowel movements are regular. Take Ibuprofen 600 mg with food every 6 hours for 5 days then as needed for pain Take iron supplement of  your choice,  twice a day for 3 months  You may drive after 2 weeks You may walk up steps  You may shower  You may resume a regular diet  Keep incisions clean and dry; remove honeycomb dressing on May 16, 2016 Do not lift over 15 pounds for 6 weeks Avoid anything in vagina for 6 weeks (or until after your post-operative visit)

## 2016-05-11 NOTE — Progress Notes (Signed)
Susan Gill is a89 y.o.  [id number]  Post Op Date # 2: Attempted Laparoscopic Hysterectomy/Abdominal Hysterectomy/Bilateral Salpingectomy/ Lysis of Adhesions and Cystoscopy  Subjective: Patient is Doing well postoperatively. Patient has Pain is not well controlled.  Medications being used: prescription NSAID's including Ibuprofen and narcotic analgesics including Percocet. Has passed flatus and is voiding without difficulty.  Ambulating in the halls without dizziness and tolerating a regular diet.   Objective: Vital signs in last 24 hours: Temp:  [98.2 F (36.8 C)-99.9 F (37.7 C)] 98.4 F (36.9 C) (04/26 0645) Pulse Rate:  [55-79] 61 (04/26 0645) Resp:  [18-20] 20 (04/26 0645) BP: (77-117)/(34-55) 90/55 (04/26 0645) SpO2:  [94 %-100 %] 99 % (04/26 0645)  Intake/Output from previous day: 04/25 0701 - 04/26 0700 In: 680 [P.O.:680] Out: 1300 [Urine:1300] Intake/Output this shift: No intake/output data recorded.  Recent Labs Lab 05/10/16 0533 05/10/16 1637 05/11/16 0509  WBC 12.6* 13.5* 10.6*  HGB 9.5* 9.0* 8.0*  HCT 27.7* 27.1* 23.8*  PLT 356 337 311     Recent Labs Lab 05/10/16 0533  NA 136  K 4.1  CL 105  CO2 22  BUN 9  CREATININE 0.77  CALCIUM 8.8*  GLUCOSE 139*    EXAM: General: alert, cooperative and no distress Resp: clear to auscultation bilaterally Cardio: regular rate and rhythm, S1, S2 normal, no murmur, click, rub or gallop GI: Bowel sounds present: dressings are clean/dry/intact. Extremities: Homans sign is negative, no sign of DVT and no calf tenderness.   Assessment: s/p Procedure(s): ATTEMPTED TOTAL LAPAROSCOPIC  HYSTERECTOMY WITH SALPINGECTOMY HYSTERECTOMY ABDOMINAL WITH SALPINGECTOMY CYSTOSCOPY: stable, progressing well, tolerating diet and anemia  Plan: Consider discharge home today.  LOS: 2 days    POWELL,ELMIRA, PA-C 05/11/2016 7:44 AM   Upon my arrival earlier in the day (10am), pt reported feeling light headed and  dizzy.  Her mother says it started after she took the percocet.  I discussed possible need for blood transfusion if she is not tolerating her blood loss.  Will hold percocet for now and switch to dilaudid.  Pt seen at again at about 6:30p and she felt much better with the dilaudid and felt a little light headed but was ambulating without difficulty and didn't like the idea of a blood transfusion.  Decision made to cont to observe.  Part of the concern is that the pt's BP was running much lower than her norm and BP meds had been held yet BP was still rather low.

## 2016-05-11 NOTE — Progress Notes (Signed)
Called to patient room. IV has become dislodged. IV removed.  Will monitor the need for replacement. Toya Smothers, RN

## 2016-05-11 NOTE — Discharge Summary (Signed)
Physician Discharge Summary  Patient ID: Susan Gill MRN: [id number] DOB/AGE: 1970-12-31 46 y.o.  Admit date: 05/09/2016 Discharge date: 05/11/2016   Discharge Diagnoses: Symptomatic Uterine Fibroids and Menorrhagia Active Problems:   Fibroids, intramural   Operation: Attempted Total Laparoscopic Hysterectomy, Total Abdominal Hysterectomy, Bilateral Salpingectomy, Lysis of Adhesions, Bilateral Salpingectomy and Cystoscopy   Discharged Condition: Good  Hospital Course: On the date of admission the patient underwent the aforementioned procedures and tolerated them well.  Post operative course was unremarkable with the patient resuming bowel and bladder function by post operative day #2  however, she developed some dizziness and unpleasant side effects with Percocet and was changed to Dilaudid. Following adjustment in analgesia the patient continued to improve and was ready for discharge on post operative day #3.   Discharge hemoglobin was 8.0.  Disposition: 01-Home or Self Care  Discharge Medications:  Allergies as of 05/11/2016      Reactions   Codeine Hypertension   Heart races   Other Itching   All nuts   Peanut-containing Drug Products Itching      Medication List    STOP taking these medications   acetaminophen 500 MG chewable tablet Commonly known as:  TYLENOL   cefUROXime 250 MG tablet Commonly known as:  CEFTIN   medroxyPROGESTERone 10 MG tablet Commonly known as:  PROVERA     TAKE these medications   ALLEGRA-D ALLERGY & CONGESTION 60-120 MG 12 hr tablet Generic drug:  fexofenadine-pseudoephedrine Take 1 tablet by mouth 2 (two) times daily as needed for allergies.   D3-50 50000 units capsule Generic drug:  Cholecalciferol Take 50,000 Units by mouth once a week.   EDARBYCLOR 40-12.5 MG Tabs Generic drug:  Azilsartan-Chlorthalidone Take 0.5 tablets by mouth daily.   ferrous sulfate 325 (65 FE) MG tablet Take 325 mg by mouth 2 (two) times a week.   ibuprofen 600 MG tablet Commonly known as:  ADVIL,MOTRIN 1 po pc every 6 hours for 5 days then as needed for pain What changed:  medication strength  how much to take  how to take this  when to take this  reasons to take this  additional instructions   oxyCODONE-acetaminophen 5-325 MG tablet Commonly known as:  PERCOCET/ROXICET take 1-2 tablets po every 6 hours as needed for post operative pain       Dilaudid 2 mg every 4-6 hours given instead of Oxycodone-acetaminophen 5-325 mg   Follow-up: Dr. Harvie Bridge. Mancel Bale on June 16, 2016 at 10:45 a.m.   SignedEarnstine Regal, PA-C 05/11/2016, 7:53 AM

## 2016-05-12 ENCOUNTER — Inpatient Hospital Stay (HOSPITAL_COMMUNITY): Payer: BC Managed Care – PPO

## 2016-05-12 DIAGNOSIS — M79609 Pain in unspecified limb: Secondary | ICD-10-CM

## 2016-05-12 LAB — CBC
HCT: 23.8 % — ABNORMAL LOW (ref 36.0–46.0)
Hemoglobin: 8 g/dL — ABNORMAL LOW (ref 12.0–15.0)
MCH: 31.5 pg (ref 26.0–34.0)
MCHC: 33.6 g/dL (ref 30.0–36.0)
MCV: 93.7 fL (ref 78.0–100.0)
PLATELETS: 304 10*3/uL (ref 150–400)
RBC: 2.54 MIL/uL — ABNORMAL LOW (ref 3.87–5.11)
RDW: 13.7 % (ref 11.5–15.5)
WBC: 8.8 10*3/uL (ref 4.0–10.5)

## 2016-05-12 MED ORDER — HYDROMORPHONE HCL 2 MG PO TABS: ORAL_TABLET | ORAL | 0 refills | Status: DC

## 2016-05-12 NOTE — Progress Notes (Signed)
Patient discharged home with mother. Discharge teaching, paperwork, instructions, prescriptions, and follow-up appts reviewed. No questions at this time.

## 2016-05-12 NOTE — Progress Notes (Signed)
3 Days Post-Op Procedure(s) (LRB): ATTEMPTED TOTAL LAPAROSCOPIC  HYSTERECTOMY WITH SALPINGECTOMY (Bilateral) HYSTERECTOMY ABDOMINAL WITH SALPINGECTOMY (Bilateral) CYSTOSCOPY (N/A)  Subjective: Patient reports feeling pain near incision but otherwise ok.  Objective: I have reviewed patient's vital signs and intake and output.  General: alert and no distress Resp: clear to auscultation bilaterally Cardio: regular rate and rhythm GI: soft, app tender, decreased BS, pressure dressing intact, ND Extremities: no calf tenderness, SCDs are on Vaginal Bleeding: none  Assessment: s/p Procedure(s): ATTEMPTED TOTAL LAPAROSCOPIC  HYSTERECTOMY WITH SALPINGECTOMY (Bilateral) HYSTERECTOMY ABDOMINAL WITH SALPINGECTOMY (Bilateral) CYSTOSCOPY (N/A): stable  Plan: Adequate UOP  Encourage IS CBC and BMET in am Cont routine post op care SCDs for DVT prophylaxis   LOS: 3 days    Catharine Kettlewell Y 05/12/2016, 2:10 PM

## 2016-05-12 NOTE — Progress Notes (Signed)
Susan Gill is a52 y.o.  [id number]  Post Op Date # 3: Attempted Laparoscopic Hysterectomy/Abdominal Hysterectomy/Bilateral Salpingectomy/Lysis of Adhesions/Cystoscopy  Subjective: Patient is Doing well postoperatively. Patient has Pain is controlled with current analgesics. Medications being used: prescription NSAID's including Ibuprofen 600 mg and narcotic analgesics including Dilaudid 2 mg. Patient ambulating in the halls with lightheadedness but no dizziness, voiding, passing flatus and tolerating  a regular diet.  No longer feels "loopy" with analgesia.   Objective: Vital signs in last 24 hours: Temp:  [98.1 F (36.7 C)-98.9 F (37.2 C)] 98.6 F (37 C) (04/27 0804) Pulse Rate:  [66-89] 66 (04/27 0804) Resp:  [18-20] 18 (04/27 0804) BP: (86-113)/(38-67) 113/57 (04/27 0804) SpO2:  [98 %-100 %] 100 % (04/27 0804)  Intake/Output from previous day: No intake/output data recorded. Intake/Output this shift: No intake/output data recorded.  Recent Labs Lab 05/10/16 1637 05/11/16 0509 05/12/16 0533  WBC 13.5* 10.6* 8.8  HGB 9.0* 8.0* 8.0*  HCT 27.1* 23.8* 23.8*  PLT 337 311 304     Recent Labs Lab 05/10/16 0533  NA 136  K 4.1  CL 105  CO2 22  BUN 9  CREATININE 0.77  CALCIUM 8.8*  GLUCOSE 139*    EXAM: General: alert, cooperative and no distress Resp: clear to auscultation bilaterally Cardio: regular rate and rhythm, S1, S2 normal, no murmur, click, rub or gallop GI: Bowel sound present;  dressing is clean/dry/intact Extremities: Homans sign is negative, no sign of DVT and SCD hose in place and functioning-no calf tenderness.   Assessment: s/p Procedure(s): ATTEMPTED TOTAL LAPAROSCOPIC  HYSTERECTOMY WITH SALPINGECTOMY HYSTERECTOMY ABDOMINAL WITH SALPINGECTOMY CYSTOSCOPY: stable, progressing well, tolerating diet and anemia  Plan: Consider discharge home.  LOS: 3 days    POWELL,ELMIRA, PA-C 05/12/2016 8:14 AM  Pt doing well except now c/o  swelling and tightness/pain in her LLE.  + warmth and slight swelling.  Neg homan's.  Given that the pt is post op with these complaints, will do a doppler study of LLE.  If neg, will d/c home and pt will hold BP meds over the weekend and rto on Monday morning for BP check.  D/C instructions discussed and questions answered.

## 2016-05-12 NOTE — Progress Notes (Signed)
Preliminary results by tech - Left Lower Ext. Venous Duplex Completed. Negative for deep and superficial vein thrombosis.  Jakai Onofre, BS, RDMS, RVT  

## 2016-05-17 ENCOUNTER — Emergency Department (HOSPITAL_BASED_OUTPATIENT_CLINIC_OR_DEPARTMENT_OTHER)
Admission: EM | Admit: 2016-05-17 | Discharge: 2016-05-17 | Disposition: A | Discharge status: Home / Self Care | Payer: BC Managed Care – PPO | Attending: Emergency Medicine | Admitting: Emergency Medicine

## 2016-05-17 ENCOUNTER — Encounter (HOSPITAL_BASED_OUTPATIENT_CLINIC_OR_DEPARTMENT_OTHER): Payer: Self-pay

## 2016-05-17 ENCOUNTER — Emergency Department (HOSPITAL_BASED_OUTPATIENT_CLINIC_OR_DEPARTMENT_OTHER): Payer: BC Managed Care – PPO

## 2016-05-17 DIAGNOSIS — I10 Essential (primary) hypertension: Secondary | ICD-10-CM | POA: Diagnosis not present

## 2016-05-17 DIAGNOSIS — R0600 Dyspnea, unspecified: Secondary | ICD-10-CM

## 2016-05-17 DIAGNOSIS — J45909 Unspecified asthma, uncomplicated: Secondary | ICD-10-CM | POA: Insufficient documentation

## 2016-05-17 DIAGNOSIS — Z5181 Encounter for therapeutic drug level monitoring: Secondary | ICD-10-CM | POA: Diagnosis not present

## 2016-05-17 DIAGNOSIS — Y95 Nosocomial condition: Secondary | ICD-10-CM

## 2016-05-17 DIAGNOSIS — Z79899 Other long term (current) drug therapy: Secondary | ICD-10-CM | POA: Insufficient documentation

## 2016-05-17 DIAGNOSIS — R200 Anesthesia of skin: Secondary | ICD-10-CM | POA: Insufficient documentation

## 2016-05-17 DIAGNOSIS — R059 Cough, unspecified: Secondary | ICD-10-CM

## 2016-05-17 DIAGNOSIS — R05 Cough: Secondary | ICD-10-CM

## 2016-05-17 DIAGNOSIS — J189 Pneumonia, unspecified organism: Secondary | ICD-10-CM

## 2016-05-17 LAB — I-STAT CHEM 8, ED
BUN: 6 mg/dL (ref 6–20)
CALCIUM ION: 1.13 mmol/L — AB (ref 1.15–1.40)
CREATININE: 0.8 mg/dL (ref 0.44–1.00)
Chloride: 106 mmol/L (ref 101–111)
GLUCOSE: 106 mg/dL — AB (ref 65–99)
HCT: 29 % — ABNORMAL LOW (ref 36.0–46.0)
Hemoglobin: 9.9 g/dL — ABNORMAL LOW (ref 12.0–15.0)
Potassium: 4 mmol/L (ref 3.5–5.1)
Sodium: 140 mmol/L (ref 135–145)
TCO2: 25 mmol/L (ref 0–100)

## 2016-05-17 LAB — CBC WITH DIFFERENTIAL/PLATELET
BASOS ABS: 0 10*3/uL (ref 0.0–0.1)
BASOS PCT: 0 %
EOS ABS: 0.3 10*3/uL (ref 0.0–0.7)
Eosinophils Relative: 3 %
HEMATOCRIT: 28.7 % — AB (ref 36.0–46.0)
HEMOGLOBIN: 9.1 g/dL — AB (ref 12.0–15.0)
LYMPHS ABS: 2.8 10*3/uL (ref 0.7–4.0)
Lymphocytes Relative: 26 %
MCH: 30 pg (ref 26.0–34.0)
MCHC: 31.7 g/dL (ref 30.0–36.0)
MCV: 94.7 fL (ref 78.0–100.0)
MONOS PCT: 10 %
Monocytes Absolute: 1.1 10*3/uL — ABNORMAL HIGH (ref 0.1–1.0)
NEUTROS ABS: 6.5 10*3/uL (ref 1.7–7.7)
Neutrophils Relative %: 61 %
Platelets: 467 10*3/uL — ABNORMAL HIGH (ref 150–400)
RBC: 3.03 MIL/uL — ABNORMAL LOW (ref 3.87–5.11)
RDW: 12.4 % (ref 11.5–15.5)
WBC: 10.7 10*3/uL — ABNORMAL HIGH (ref 4.0–10.5)

## 2016-05-17 LAB — URINALYSIS, ROUTINE W REFLEX MICROSCOPIC
Bilirubin Urine: NEGATIVE
Glucose, UA: NEGATIVE mg/dL
KETONES UR: NEGATIVE mg/dL
Nitrite: NEGATIVE
PROTEIN: NEGATIVE mg/dL
Specific Gravity, Urine: 1.014 (ref 1.005–1.030)
pH: 6.5 (ref 5.0–8.0)

## 2016-05-17 LAB — TROPONIN I

## 2016-05-17 LAB — URINALYSIS, MICROSCOPIC (REFLEX)

## 2016-05-17 LAB — PROTIME-INR
INR: 0.98
Prothrombin Time: 13 seconds (ref 11.4–15.2)

## 2016-05-17 LAB — I-STAT CG4 LACTIC ACID, ED: Lactic Acid, Venous: 1.03 mmol/L (ref 0.5–1.9)

## 2016-05-17 MED ORDER — VANCOMYCIN HCL IN DEXTROSE 1-5 GM/200ML-% IV SOLN
1000.0000 mg | Freq: Once | INTRAVENOUS | Status: AC
  Administered 2016-05-17: 1000 mg via INTRAVENOUS
  Filled 2016-05-17: qty 200

## 2016-05-17 MED ORDER — VANCOMYCIN HCL IN DEXTROSE 1-5 GM/200ML-% IV SOLN: 1000.0000 mg | Freq: Two times a day (BID) | INTRAVENOUS | Status: DC

## 2016-05-17 MED ORDER — IOPAMIDOL (ISOVUE-370) INJECTION 76%
100.0000 mL | Freq: Once | INTRAVENOUS | Status: AC | PRN
  Administered 2016-05-17: 100 mL via INTRAVENOUS

## 2016-05-17 MED ORDER — LEVOFLOXACIN 750 MG PO TABS: 750.0000 mg | ORAL_TABLET | Freq: Every day | ORAL | 0 refills | Status: AC

## 2016-05-17 MED ORDER — CEFEPIME HCL 1 G IJ SOLR
INTRAMUSCULAR | Status: AC
  Administered 2016-05-17: 11:00:00
  Filled 2016-05-17: qty 1

## 2016-05-17 MED ORDER — SODIUM CHLORIDE 0.9 % IV BOLUS (SEPSIS)
1000.0000 mL | Freq: Once | INTRAVENOUS | Status: AC
  Administered 2016-05-17: 1000 mL via INTRAVENOUS

## 2016-05-17 MED ORDER — VANCOMYCIN HCL 500 MG IV SOLR
500.0000 mg | Freq: Once | INTRAVENOUS | Status: DC
  Filled 2016-05-17: qty 500

## 2016-05-17 MED ORDER — VANCOMYCIN HCL 500 MG IV SOLR
INTRAVENOUS | Status: AC
  Administered 2016-05-17: 500 mg
  Filled 2016-05-17: qty 500

## 2016-05-17 MED ORDER — VANCOMYCIN HCL 10 G IV SOLR
2500.0000 mg | Freq: Once | INTRAVENOUS | Status: DC
  Filled 2016-05-17: qty 2500

## 2016-05-17 MED ORDER — DEXTROSE 5 % IV SOLN
1.0000 g | Freq: Once | INTRAVENOUS | Status: AC
  Administered 2016-05-17: 1 g via INTRAVENOUS

## 2016-05-17 MED FILL — levoFLOXacin 750 MG TABS: 750 | 7 days supply | Qty: 7 | Fill #0

## 2016-05-17 NOTE — ED Provider Notes (Signed)
[facility] Provider Note   CSN: [id number] Arrival date & time: 05/17/16  6295     History   Chief Complaint Chief Complaint  Patient presents with  . Shortness of Breath  . Numbness    HPI Susan Gill is a 46 y.o. female.  The history is provided by the patient.  Cough  This is a new problem. The current episode started more than 2 days ago. The problem occurs constantly. The problem has not changed since onset.The cough is non-productive. There has been no fever (chills present). The fever has been present for less than 1 day. Associated symptoms include chest pain, chills, rhinorrhea and shortness of breath. Pertinent negatives include no sweats, no headaches, no sore throat and no wheezing. She has tried nothing for the symptoms. The treatment provided no relief. Her past medical history is significant for pneumonia (hx of 4 pneumonias) and asthma. Her past medical history does not include COPD.    Past Medical History:  Diagnosis Date  . Asthma   . Fibroids   . Hypertension   . Numbness of extremity    notices after waking up from sleep  . Pneumonia   . Sciatica     Patient Active Problem List   Diagnosis Date Noted  . Fibroids, intramural 05/09/2016  . Fibroids 02/09/2012  . Dysmenorrhea 02/09/2012  . Menorrhagia 02/09/2012    Past Surgical History:  Procedure Laterality Date  . CESAREAN SECTION    . CYSTOSCOPY N/A 05/09/2016   Procedure: CYSTOSCOPY;  Surgeon: Everett Graff, MD;  Location: [facility];  Service: Gynecology;  Laterality: N/A;  . HYSTERECTOMY ABDOMINAL WITH SALPINGECTOMY Bilateral 05/09/2016   Procedure: HYSTERECTOMY ABDOMINAL WITH SALPINGECTOMY;  Surgeon: Everett Graff, MD;  Location: [facility];  Service: Gynecology;  Laterality: Bilateral;  . LAPAROSCOPIC VAGINAL HYSTERECTOMY WITH SALPINGECTOMY Bilateral 05/09/2016   Procedure: ATTEMPTED TOTAL LAPAROSCOPIC  HYSTERECTOMY WITH SALPINGECTOMY;  Surgeon: Everett Graff, MD;  Location:  [facility];  Service: Gynecology;  Laterality: Bilateral;  . TUBAL LIGATION      OB History    Gravida Para Term Preterm AB Living   2 2 2     2    SAB TAB Ectopic Multiple Live Births                   Home Medications    Prior to Admission medications   Medication Sig Start Date End Date Taking? Authorizing Provider  ALLEGRA-D ALLERGY & CONGESTION 60-120 MG 12 hr tablet Take 1 tablet by mouth 2 (two) times daily as needed for allergies. 03/26/16  Yes Historical Provider, MD  Azilsartan-Chlorthalidone (EDARBYCLOR) 40-12.5 MG TABS Take 0.5 tablets by mouth daily.    Yes Historical Provider, MD  D3-50 50000 units capsule Take 50,000 Units by mouth once a week. 02/23/16  Yes Historical Provider, MD  ibuprofen (ADVIL,MOTRIN) 600 MG tablet 1 po pc every 6 hours for 5 days then as needed for pain 05/11/16  Yes Earnstine Regal, PA-C  ferrous sulfate 325 (65 FE) MG tablet Take 325 mg by mouth 2 (two) times a week.    Historical Provider, MD  HYDROmorphone (DILAUDID) 2 MG tablet 1 po every 4-6 hours as needed for post operative pain 05/12/16   Earnstine Regal, PA-C    Family History Family History  Problem Relation Age of Onset  . Cancer Mother     thyroid  . Diabetes Father   . Hypertension Maternal Grandmother   . Kidney disease Maternal Grandmother   . Diabetes  Paternal Grandmother   . Diabetes Paternal Grandfather     Social History Social History  Substance Use Topics  . Smoking status: Never Smoker  . Smokeless tobacco: Never Used  . Alcohol use No     Allergies   Codeine; Dilaudid [hydromorphone hcl]; Other; Peanut-containing drug products; and Percocet [oxycodone-acetaminophen]   Review of Systems Review of Systems  Constitutional: Positive for chills and fatigue. Negative for appetite change, diaphoresis and fever.  HENT: Positive for rhinorrhea. Negative for congestion and sore throat.   Respiratory: Positive for cough, chest tightness and shortness of breath. Negative for  choking, wheezing and stridor.   Cardiovascular: Positive for chest pain. Negative for palpitations and leg swelling.  Gastrointestinal: Negative for constipation, diarrhea, nausea and vomiting.  Genitourinary: Negative for dysuria and flank pain.  Musculoskeletal: Negative for back pain, neck pain and neck stiffness.  Skin: Positive for wound. Negative for rash.  Neurological: Negative for light-headedness, numbness and headaches.  Psychiatric/Behavioral: Negative for agitation and confusion.  All other systems reviewed and are negative.    Physical Exam Updated Vital Signs BP 127/72 (BP Location: Right Arm)   Pulse 67   Temp 97.8 F (36.6 C) (Oral)   Resp 18   Ht 5\' 5"  (1.651 m)   Wt 272 lb (123.4 kg)   LMP 01/10/2016 Comment: continous bleeding  SpO2 98%   BMI 45.26 kg/m   Physical Exam  Constitutional: She is oriented to person, place, and time. She appears well-developed and well-nourished. No distress.  HENT:  Head: Normocephalic and atraumatic.  Mouth/Throat: Oropharynx is clear and moist. No oropharyngeal exudate.  Eyes: Conjunctivae and EOM are normal. Pupils are equal, round, and reactive to light.  Neck: Neck supple.  Cardiovascular: Normal rate and regular rhythm.   No murmur heard. Pulmonary/Chest: Effort normal and breath sounds normal. No stridor. No tachypnea. No respiratory distress. She has no wheezes.  Abdominal: Soft. Normal appearance. She exhibits no distension. There is no tenderness. There is no rigidity, no rebound, no guarding and no CVA tenderness.    Musculoskeletal: She exhibits no edema or tenderness.  Neurological: She is alert and oriented to person, place, and time. She is not disoriented. She displays no tremor and normal reflexes. No cranial nerve deficit or sensory deficit. She exhibits normal muscle tone. Coordination and gait normal. GCS eye subscore is 4. GCS verbal subscore is 5. GCS motor subscore is 6.  No focal nenurologic deficits.  Normal strength, sensation, coordination, gait, and pulses in all extremities.   Skin: Skin is warm and dry. Capillary refill takes less than 2 seconds. No rash noted. She is not diaphoretic. No erythema.  Psychiatric: She has a normal mood and affect. Her behavior is normal.  Nursing note and vitals reviewed.    ED Treatments / Results  Labs (all labs ordered are listed, but only abnormal results are displayed) Labs Reviewed  CBC WITH DIFFERENTIAL/PLATELET - Abnormal; Notable for the following:       Result Value   WBC 10.7 (*)    RBC 3.03 (*)    Hemoglobin 9.1 (*)    HCT 28.7 (*)    Platelets 467 (*)    Monocytes Absolute 1.1 (*)    All other components within normal limits  URINALYSIS, ROUTINE W REFLEX MICROSCOPIC - Abnormal; Notable for the following:    Hgb urine dipstick TRACE (*)    Leukocytes, UA MODERATE (*)    All other components within normal limits  URINALYSIS, MICROSCOPIC (REFLEX) - Abnormal;  Notable for the following:    Bacteria, UA MANY (*)    Squamous Epithelial / LPF 0-5 (*)    All other components within normal limits  I-STAT CHEM 8, ED - Abnormal; Notable for the following:    Glucose, Bld 106 (*)    Calcium, Ion 1.13 (*)    Hemoglobin 9.9 (*)    HCT 29.0 (*)    All other components within normal limits  CULTURE, BLOOD (ROUTINE X 2)  CULTURE, BLOOD (ROUTINE X 2)  PROTIME-INR  TROPONIN I  TROPONIN I  I-STAT CG4 LACTIC ACID, ED    EKG  EKG Interpretation  Date/Time:  Wednesday May 17 2016 06:34:20 EDT Ventricular Rate:  66 PR Interval:    QRS Duration: 84 QT Interval:  403 QTC Calculation: 423 R Axis:   -22 Text Interpretation:  Sinus rhythm Abnormal R-wave progression, late transition LVH by voltage Confirmed by Stark Jock  MD, DOUGLAS ([id number]) on 05/17/2016 6:38:18 AM       Radiology Ct Head Wo Contrast  Result Date: 05/17/2016 CLINICAL DATA:  Left-sided numbness. Chest pain. Dyspnea. Recent hysterectomy. EXAM: CT HEAD WITHOUT CONTRAST TECHNIQUE:  Contiguous axial images were obtained from the base of the skull through the vertex without intravenous contrast. COMPARISON:  09/28/2007 head CT. FINDINGS: Brain: No evidence of parenchymal hemorrhage or extra-axial fluid collection. No mass lesion, mass effect, or midline shift. No CT evidence of acute infarction. Cerebral volume is age appropriate. No ventriculomegaly. Vascular: No hyperdense vessel or unexpected calcification. Skull: No evidence of calvarial fracture. Sinuses/Orbits: The visualized paranasal sinuses are essentially clear. Other:  The mastoid air cells are unopacified. IMPRESSION: Negative head CT.  No evidence of acute intracranial abnormality. Electronically Signed   By: Ilona Sorrel M.D.   On: 05/17/2016 08:20   Ct Angio Chest Pe W And/or Wo Contrast  Result Date: 05/17/2016 CLINICAL DATA:  Shortness of breath, dizziness, and chest pain beginning yesterday. One week postop from hysterectomy. EXAM: CT ANGIOGRAPHY CHEST WITH CONTRAST TECHNIQUE: Multidetector CT imaging of the chest was performed using the standard protocol during bolus administration of intravenous contrast. Multiplanar CT image reconstructions and MIPs were obtained to evaluate the vascular anatomy. CONTRAST:  100 mL Isovue 370 COMPARISON:  04/29/2008 FINDINGS: Cardiovascular: Satisfactory opacification of pulmonary arteries noted, and no pulmonary emboli identified. No evidence of thoracic aortic dissection or aneurysm. Mediastinum/Nodes: No masses or pathologically enlarged lymph nodes identified. Lungs/Pleura: Mild opacity is seen in the medial right lung base which may be due to infiltrate or atelectasis. Left lung is clear. No evidence of mass or pleural effusion. Upper abdomen: Diffuse hepatic steatosis. Musculoskeletal: No suspicious bone lesions identified. Review of the MIP images confirms the above findings. IMPRESSION: No radiographic evidence of pulmonary embolism. Mild infiltrate versus atelectasis in medial  right lung base. Diffuse hepatic steatosis. Electronically Signed   By: Earle Gell M.D.   On: 05/17/2016 08:21    Procedures Procedures (including critical care time)  Medications Ordered in ED Medications  vancomycin (VANCOCIN) IVPB 1000 mg/200 mL premix (0 mg Intravenous Stopped 05/17/16 1418)    And  vancomycin (VANCOCIN) IVPB 1000 mg/200 mL premix (0 mg Intravenous Stopped 05/17/16 1253)    And  vancomycin (VANCOCIN) 500 mg in sodium chloride 0.9 % 100 mL IVPB (500 mg Intravenous Not Given 05/17/16 1430)  sodium chloride 0.9 % bolus 1,000 mL (0 mLs Intravenous Stopped 05/17/16 0922)  iopamidol (ISOVUE-370) 76 % injection 100 mL (100 mLs Intravenous Contrast Given 05/17/16 0741)  ceFEPIme (MAXIPIME)  1 g in dextrose 5 % 50 mL IVPB (0 g Intravenous Stopped 05/17/16 1146)  ceFEPIme (MAXIPIME) 1 g injection (  Given 05/17/16 1102)  vancomycin (VANCOCIN) 500 MG powder (500 mg  Given 05/17/16 1420)     Initial Impression / Assessment and Plan / ED Course  I have reviewed the triage vital signs and the nursing notes.  Pertinent labs & imaging results that were available during my care of the patient were reviewed by me and considered in my medical decision making (see chart for details).     Susan Gill is a 46 y.o. female with a past medical history significant for hypertension, asthma, and recent hysterectomy 1 week ago who presents with shortness of breath, chest pain, cough, chills, left-sided back pain,  and left arm numbness. Patient reports that She was discharged 6 days ago and since that time has had worsening shortness of breath. She said that before discharge, she had left leg pain and swelling and redness which was negative for DVT on ultrasound. She reports that the symptoms have improved but her shortness of breath has worsened. She said that overnight, she will quit 3 and with shortness of breath, and crushing left-sided chest pain. She described the pain as a 7 out of 10. She said  it radiated towards her left shoulder. She reports weakness in her left arm and left-sided back pain that is tender. She reports that she has had chronic abdominal cramping but it is improving. She denies any discharge or drainage or redness at the surgical site. She reports that she has had some constipated bowel movement since the procedure does have urinary frequency but no burning. She denies syncope, lightheadedness, or any other symptoms.  History and exam are seen above. On exam, patient had clear lungs. Chest is nontender. Abdomen nontender. Left back was tender with sensation of muscle spasm. Patient had normal sensation and grip strength in arms. Normal finger-nose-finger. Normal pulses. Lower extremity did not appear edematous or erythematous.  Based on patient's description of symptoms, labs and imaging ordered to look for postoperative infection, UTI, and pneumonia. Patient also had imaging of the head given the left arm numbness however, this may be related to her chest pain. Patient had PE study ordered given the recent workup of possible DVT and negative ultrasound. PE or paradoxical stroke considered but felt less likely.  Patient given fluids  during initial workup.   CT scan showed no evidence of pulmonary embolism but did show evidence of pneumonia. Given the patient's recent admission, elevated white blood cell count, shortness of breath with chest pain, and cough, patient will be treated for hospital associated pneumonia. Antibiotics will be ordered.  Hospitalist team called for admission however, on the review of the workup and vitals, they requested conversation be held with patient about possible discharge. Shared decision-making conversation held with patient and patient was agreeable for obtaining IV antibiotics in the emergency department and then if patient continues to feel well, discharged home with Levaquin for outpatient management of hospital associated  pneumonia.  Patient received her IV Antibiotics and continued to have improvement in symptoms. Patient did not have shortness of breath or chest pain any longer. Patient continued to have stable and normal vital signs.  Patient discharged home with prescription for Levaquin and instructions to follow-up with PCP in the next several days. Patient understood extremity strict return precautions for any new or worsening symptoms it would likely to her admission. Patient was understanding of this  plan and had no questions or concerns. Patient was discharged in good condition with a normal gait and improvement in presenting symptoms.    Final Clinical Impressions(s) / ED Diagnoses   Final diagnoses:  Dyspnea, unspecified type  Cough  HAP (hospital-acquired pneumonia)    New Prescriptions Discharge Medication List as of 05/17/2016  3:27 PM    START taking these medications   Details  levofloxacin (LEVAQUIN) 750 MG tablet Take 1 tablet (750 mg total) by mouth daily., Starting Wed 05/17/2016, Until Wed 05/24/2016, Print        Clinical Impression: 1. Dyspnea, unspecified type   2. Cough   3. HAP (hospital-acquired pneumonia)     Disposition: Discharge  Condition: Good  I have discussed the results, Dx and Tx plan with the pt(& family if present). He/she/they expressed understanding and agree(s) with the plan. Discharge instructions discussed at great length. Strict return precautions discussed and pt &/or family have verbalized understanding of the instructions. No further questions at time of discharge.    Discharge Medication List as of 05/17/2016  3:27 PM    START taking these medications   Details  levofloxacin (LEVAQUIN) 750 MG tablet Take 1 tablet (750 mg total) by mouth daily., Starting Wed 05/17/2016, Until Wed 05/24/2016, Print        Follow Up: Lucianne Lei, MD [address] [place] [address] [phone]  Schedule an appointment as soon as possible for a visit     [place] [address] [id number] [facility] [address] [place] [phone]  If symptoms worsen     Courtney Paris, MD 05/17/16 Joen Laura

## 2016-05-17 NOTE — ED Notes (Signed)
Patient reports that her left arm felt "funny" while she was recently hospitalized. Mother states patient has been mentally "foggy" since she was hospitalized.

## 2016-05-17 NOTE — ED Notes (Signed)
ED Provider at bedside. 

## 2016-05-17 NOTE — Discharge Instructions (Signed)
Please take the antibiotics to treat your pneumonia. Workup today did not show evidence of other problems. Please stay hydrated and rest. Please follow-up with your primary doctor in the next several days. If any symptoms change or worsen, please return to the nearest emergency department for further management.

## 2016-05-17 NOTE — ED Notes (Signed)
Notified EDP of patients chief complaint.

## 2016-05-17 NOTE — Progress Notes (Signed)
Pharmacy Antibiotic Note  Susan Gill is a 46 y.o. female admitted on 05/17/2016 with SOB, chest pain, cough and chills. Concern for healthcare-associate pneumonia given pt was admitted ~ 1 week ago at [facility].  WBC 10.7, SCr 0.8, LA wnl. Pt received cefepime x1 in ED, will also initiate vancomycin.   Plan: Vancomycin 1 IV every 12 hours.  Goal trough 15-20 mcg/mL.  Monitor renal fx, cultures Obtain VT at steady state given body habitus F/u for continuation of gram negative coverage   Height: 5\' 5"  (165.1 cm) Weight: 272 lb (123.4 kg) IBW/kg (Calculated) : 57  Temp (24hrs), Avg:97.8 F (36.6 C), Min:97.8 F (36.6 C), Max:97.8 F (36.6 C)   Recent Labs Lab 05/10/16 1637 05/11/16 0509 05/12/16 0533 05/17/16 0720 05/17/16 0739 05/17/16 0740  WBC 13.5* 10.6* 8.8 10.7*  --   --   CREATININE  --   --   --   --  0.80  --   LATICACIDVEN  --   --   --   --   --  1.03    Estimated Creatinine Clearance: 114.7 mL/min (by C-G formula based on SCr of 0.8 mg/dL).    Allergies  Allergen Reactions  . Codeine Hypertension    Heart races  . Dilaudid [Hydromorphone Hcl]     confused  . Other Itching    All nuts  . Peanut-Containing Drug Products Itching  . Percocet [Oxycodone-Acetaminophen]     Breathing, "had to wear a happy button."    Antimicrobials this admission: 5/2 cefepime x1 5/2 vancomycin >   Dose adjustments this admission: N/A   Microbiology results: 5/2 blood cx:    Harvel Quale 05/17/2016 10:09 AM

## 2016-05-17 NOTE — ED Triage Notes (Signed)
Pt reports she had a total hysterectomy last Tuesday, states worked up for DVT Friday but it was ruled out  As being negative. Woke up this morning around 3am with shortness of breath, left arm/shoulder/scapular weakness. States advised to have a blood transfusion prior to hospital discharge but she declined it. Denies bleeding, fevers, rashes. Surgical site to pelvic area has steri strips intact, without drainage, redness, or odor.

## 2016-05-17 NOTE — ED Notes (Signed)
Unable to obtain IV access and lab x2 attempts - Soil scientist.

## 2016-05-17 NOTE — ED Notes (Signed)
No repeat EKG needed per MD

## 2016-05-17 NOTE — ED Notes (Signed)
Patient transported to CT 

## 2016-05-22 LAB — CULTURE, BLOOD (ROUTINE X 2)
Culture: NO GROWTH
Culture: NO GROWTH
Special Requests: ADEQUATE
Special Requests: ADEQUATE

## 2017-04-27 ENCOUNTER — Other Ambulatory Visit: Payer: Self-pay | Admitting: Family Medicine

## 2017-04-27 DIAGNOSIS — R1013 Epigastric pain: Secondary | ICD-10-CM

## 2017-05-09 ENCOUNTER — Ambulatory Visit
Admission: RE | Admit: 2017-05-09 | Discharge: 2017-05-09 | Disposition: A | Discharge status: Home / Self Care | Payer: BC Managed Care – PPO | Source: Ambulatory Visit | Attending: Family Medicine | Admitting: Family Medicine

## 2017-05-09 DIAGNOSIS — R1013 Epigastric pain: Secondary | ICD-10-CM

## 2018-01-18 ENCOUNTER — Emergency Department (HOSPITAL_COMMUNITY)
Admission: EM | Admit: 2018-01-18 | Discharge: 2018-01-18 | Disposition: A | Discharge status: Home / Self Care | Payer: BC Managed Care – PPO | Attending: Emergency Medicine | Admitting: Emergency Medicine

## 2018-01-18 ENCOUNTER — Other Ambulatory Visit: Payer: Self-pay

## 2018-01-18 ENCOUNTER — Encounter (HOSPITAL_COMMUNITY): Payer: Self-pay | Admitting: Emergency Medicine

## 2018-01-18 DIAGNOSIS — Y999 Unspecified external cause status: Secondary | ICD-10-CM | POA: Diagnosis not present

## 2018-01-18 DIAGNOSIS — S161XXA Strain of muscle, fascia and tendon at neck level, initial encounter: Secondary | ICD-10-CM | POA: Diagnosis not present

## 2018-01-18 DIAGNOSIS — Y92410 Unspecified street and highway as the place of occurrence of the external cause: Secondary | ICD-10-CM | POA: Diagnosis not present

## 2018-01-18 DIAGNOSIS — Y9389 Activity, other specified: Secondary | ICD-10-CM | POA: Insufficient documentation

## 2018-01-18 DIAGNOSIS — S199XXA Unspecified injury of neck, initial encounter: Secondary | ICD-10-CM | POA: Diagnosis present

## 2018-01-18 MED ORDER — CYCLOBENZAPRINE HCL 10 MG PO TABS: 10.0000 mg | ORAL_TABLET | Freq: Two times a day (BID) | ORAL | 0 refills | Status: DC | PRN

## 2018-01-18 MED ORDER — IBUPROFEN 400 MG PO TABS
400.0000 mg | ORAL_TABLET | Freq: Once | ORAL | Status: AC | PRN
  Administered 2018-01-18: 400 mg via ORAL
  Filled 2018-01-18: qty 1

## 2018-01-18 NOTE — Discharge Instructions (Signed)
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
## 2018-04-12 ENCOUNTER — Ambulatory Visit
Admission: RE | Admit: 2018-04-12 | Discharge: 2018-04-12 | Disposition: A | Discharge status: Home / Self Care | Payer: BC Managed Care – PPO | Source: Ambulatory Visit | Attending: Family Medicine | Admitting: Family Medicine

## 2018-04-12 ENCOUNTER — Other Ambulatory Visit: Payer: Self-pay | Admitting: Family Medicine

## 2018-04-12 ENCOUNTER — Other Ambulatory Visit: Payer: Self-pay

## 2018-04-12 DIAGNOSIS — M25511 Pain in right shoulder: Secondary | ICD-10-CM

## 2019-01-02 ENCOUNTER — Encounter (HOSPITAL_COMMUNITY): Payer: Self-pay

## 2019-01-02 ENCOUNTER — Ambulatory Visit (HOSPITAL_COMMUNITY)
Admission: EM | Admit: 2019-01-02 | Discharge: 2019-01-02 | Disposition: A | Discharge status: Home / Self Care | Payer: BC Managed Care – PPO | Attending: Internal Medicine | Admitting: Internal Medicine

## 2019-01-02 ENCOUNTER — Other Ambulatory Visit: Payer: Self-pay

## 2019-01-02 DIAGNOSIS — Z20822 Contact with and (suspected) exposure to covid-19: Secondary | ICD-10-CM

## 2019-01-02 DIAGNOSIS — Z20828 Contact with and (suspected) exposure to other viral communicable diseases: Secondary | ICD-10-CM | POA: Insufficient documentation

## 2019-01-02 DIAGNOSIS — M549 Dorsalgia, unspecified: Secondary | ICD-10-CM | POA: Diagnosis present
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
## 2019-01-02 NOTE — Discharge Instructions (Addendum)
Warm salt water gargle for sore throat

## 2019-01-03 LAB — NOVEL CORONAVIRUS, NAA (HOSP ORDER, SEND-OUT TO REF LAB; TAT 18-24 HRS): SARS-CoV-2, NAA: NOT DETECTED

## 2019-11-04 ENCOUNTER — Encounter (HOSPITAL_COMMUNITY): Payer: Self-pay

## 2019-11-04 ENCOUNTER — Ambulatory Visit (HOSPITAL_COMMUNITY)
Admission: EM | Admit: 2019-11-04 | Discharge: 2019-11-04 | Disposition: A | Discharge status: Home / Self Care | Payer: Worker's Compensation | Attending: Family Medicine | Admitting: Family Medicine

## 2019-11-04 ENCOUNTER — Other Ambulatory Visit: Payer: Self-pay

## 2019-11-04 DIAGNOSIS — S161XXA Strain of muscle, fascia and tendon at neck level, initial encounter: Secondary | ICD-10-CM | POA: Diagnosis not present

## 2019-11-04 DIAGNOSIS — S40012A Contusion of left shoulder, initial encounter: Secondary | ICD-10-CM | POA: Diagnosis not present

## 2019-11-04 MED ORDER — MELOXICAM 15 MG PO TABS: 15.0000 mg | ORAL_TABLET | Freq: Every day | ORAL | 0 refills | Status: DC | PRN

## 2019-11-04 MED ORDER — CYCLOBENZAPRINE HCL 10 MG PO TABS: 10.0000 mg | ORAL_TABLET | Freq: Two times a day (BID) | ORAL | 0 refills | Status: DC | PRN

## 2019-11-04 NOTE — ED Triage Notes (Signed)
Pt presents with left side neck pain, shoulder pain and arm pain after a chair she was sitting in a work buckled an she fell over out of the chair.

## 2019-11-08 NOTE — ED Provider Notes (Signed)
[facility]    CSN: [id number] Arrival date & time: 11/04/19  1651      History   Chief Complaint Chief Complaint  Patient presents with  . Neck Injury  . Shoulder Injury  . Arm Injury    HPI Susan Gill is a 49 y.o. female.   Here today with left sided neck and arm pain after her chair broke today and she fell to the side onto her left arm. Since incident has had stiffness and muscle soreness on that side. Denies any head impact, LOC, dizziness, N/V, numbness or tingling, swelling, discoloration. Has not taken anything for sxs since onset.      Past Medical History:  Diagnosis Date  . Asthma   . Fibroids   . Hypertension   . Numbness of extremity    notices after waking up from sleep  . Pneumonia   . Sciatica     Patient Active Problem List   Diagnosis Date Noted  . Fibroids, intramural 05/09/2016  . Fibroids 02/09/2012  . Dysmenorrhea 02/09/2012  . Menorrhagia 02/09/2012    Past Surgical History:  Procedure Laterality Date  . CESAREAN SECTION    . CYSTOSCOPY N/A 05/09/2016   Procedure: CYSTOSCOPY;  Surgeon: Everett Graff, MD;  Location: [facility];  Service: Gynecology;  Laterality: N/A;  . HYSTERECTOMY ABDOMINAL WITH SALPINGECTOMY Bilateral 05/09/2016   Procedure: HYSTERECTOMY ABDOMINAL WITH SALPINGECTOMY;  Surgeon: Everett Graff, MD;  Location: [facility];  Service: Gynecology;  Laterality: Bilateral;  . LAPAROSCOPIC VAGINAL HYSTERECTOMY WITH SALPINGECTOMY Bilateral 05/09/2016   Procedure: ATTEMPTED TOTAL LAPAROSCOPIC  HYSTERECTOMY WITH SALPINGECTOMY;  Surgeon: Everett Graff, MD;  Location: [facility];  Service: Gynecology;  Laterality: Bilateral;  . TUBAL LIGATION      OB History    Gravida  2   Para  2   Term  2   Preterm      AB      Living  2     SAB      TAB      Ectopic      Multiple      Live Births               Home Medications    Prior to Admission medications   Medication Sig Start Date End Date Taking?  Authorizing Provider  ALLEGRA-D ALLERGY & CONGESTION 60-120 MG 12 hr tablet Take 1 tablet by mouth 2 (two) times daily as needed for allergies. 03/26/16   [provider]  Azilsartan-Chlorthalidone (EDARBYCLOR) 40-12.5 MG TABS Take 0.5 tablets by mouth daily.     [provider]  cyclobenzaprine (FLEXERIL) 10 MG tablet Take 1 tablet (10 mg total) by mouth 2 (two) times daily as needed for muscle spasms. DO NOT DRINK ALCOHOL OR DRIVE WHILE TAKING THIS MEDICATION 11/04/19   Volney American, PA-C  D3-50 50000 units capsule Take 50,000 Units by mouth once a week. 02/23/16   [provider]  ferrous sulfate 325 (65 FE) MG tablet Take 325 mg by mouth 2 (two) times a week.    [provider]  meloxicam (MOBIC) 15 MG tablet Take 1 tablet (15 mg total) by mouth daily as needed for pain. 11/04/19   Volney American, PA-C    Family History Family History  Problem Relation Age of Onset  . Cancer Mother        thyroid  . Diabetes Father   . Hypertension Maternal Grandmother   . Kidney disease Maternal Grandmother   .  Diabetes Paternal Grandmother   . Diabetes Paternal Grandfather     Social History Social History   Tobacco Use  . Smoking status: Never Smoker  . Smokeless tobacco: Never Used  Vaping Use  . Vaping Use: Never used  Substance Use Topics  . Alcohol use: No  . Drug use: No     Allergies   Codeine, Dilaudid [hydromorphone hcl], Other, Peanut-containing drug products, and Percocet [oxycodone-acetaminophen]   Review of Systems Review of Systems PER HPI   Physical Exam Triage Vital Signs ED Triage Vitals  Enc Vitals Group     BP 11/04/19 1736 96/70     Pulse Rate 11/04/19 1736 80     Resp 11/04/19 1736 18     Temp 11/04/19 1736 98.1 F (36.7 C)     Temp Source 11/04/19 1736 Oral     SpO2 11/04/19 1736 99 %     Weight --      Height --      Head Circumference --      Peak Flow --      Pain Score 11/04/19 1735 9      Pain Loc --      Pain Edu? --      Excl. in [place]? --    No data found.  Updated Vital Signs BP 96/70 (BP Location: Right Arm)   Pulse 80   Temp 98.1 F (36.7 C) (Oral)   Resp 18   LMP 01/10/2016   SpO2 99%   Visual Acuity Right Eye Distance:   Left Eye Distance:   Bilateral Distance:    Right Eye Near:   Left Eye Near:    Bilateral Near:     Physical Exam Vitals and nursing note reviewed.  Constitutional:      Appearance: Normal appearance. She is not ill-appearing.  HENT:     Head: Atraumatic.  Eyes:     Extraocular Movements: Extraocular movements intact.     Conjunctiva/sclera: Conjunctivae normal.  Cardiovascular:     Rate and Rhythm: Normal rate and regular rhythm.     Heart sounds: Normal heart sounds.  Pulmonary:     Effort: Pulmonary effort is normal. No respiratory distress.     Breath sounds: Normal breath sounds. No wheezing or rales.  Abdominal:     General: Bowel sounds are normal. There is no distension.     Palpations: Abdomen is soft.     Tenderness: There is no abdominal tenderness. There is no guarding.  Musculoskeletal:        General: Tenderness (diffuse ttp left trapezius, deltoid and down left arm) and signs of injury present. No swelling or deformity.     Cervical back: Normal range of motion and neck supple.     Comments: Overall ROM left UE intact though her pain limits some portions of exam. No joint laxity or displacement on palpation and PROM  Skin:    General: Skin is warm and dry.     Findings: No bruising or erythema.  Neurological:     Mental Status: She is alert and oriented to person, place, and time.     Sensory: No sensory deficit.     Motor: No weakness.     Gait: Gait normal.  Psychiatric:        Mood and Affect: Mood normal.        Thought Content: Thought content normal.        Judgment: Judgment normal.     UC Treatments / Results  Labs (all labs ordered are listed, but only abnormal results are displayed) Labs  Reviewed - No data to display  EKG   Radiology No results found.  Procedures Procedures (including critical care time)  Medications Ordered in UC Medications - No data to display  Initial Impression / Assessment and Plan / UC Course  I have reviewed the triage vital signs and the nursing notes.  Pertinent labs & imaging results that were available during my care of the patient were reviewed by me and considered in my medical decision making (see chart for details).     Exam findings consistent with muscular soreness from fall from chair, no obvious evidence of bony injury today. Will tx with heat, epsom salt soaks, flexeril and meloxicam prn, rest. F/u if sxs worsening or not resolving over next week or so.   Final Clinical Impressions(s) / UC Diagnoses   Final diagnoses:  Contusion of left shoulder, initial encounter  Acute strain of neck muscle, initial encounter   Discharge Instructions   None    ED Prescriptions    Medication Sig Dispense Auth. Provider   cyclobenzaprine (FLEXERIL) 10 MG tablet Take 1 tablet (10 mg total) by mouth 2 (two) times daily as needed for muscle spasms. DO NOT DRINK ALCOHOL OR DRIVE WHILE TAKING THIS MEDICATION 14 tablet Volney American, PA-C   meloxicam (MOBIC) 15 MG tablet Take 1 tablet (15 mg total) by mouth daily as needed for pain. 30 tablet Volney American, [place]     PDMP not reviewed this encounter.   Merrie Roof Al[place], [place] 11/08/19 [phone]

## 2019-11-28 ENCOUNTER — Other Ambulatory Visit: Payer: Self-pay | Admitting: Family Medicine

## 2019-11-29 ENCOUNTER — Ambulatory Visit: Payer: Self-pay

## 2020-02-10 ENCOUNTER — Other Ambulatory Visit: Payer: Self-pay

## 2020-02-10 ENCOUNTER — Encounter (HOSPITAL_COMMUNITY): Payer: Self-pay

## 2020-02-10 ENCOUNTER — Ambulatory Visit (HOSPITAL_COMMUNITY)
Admission: EM | Admit: 2020-02-10 | Discharge: 2020-02-10 | Disposition: A | Discharge status: Home / Self Care | Payer: BC Managed Care – PPO | Attending: Internal Medicine | Admitting: Internal Medicine

## 2020-02-10 ENCOUNTER — Ambulatory Visit (INDEPENDENT_AMBULATORY_CARE_PROVIDER_SITE_OTHER): Payer: BC Managed Care – PPO

## 2020-02-10 DIAGNOSIS — U071 COVID-19: Secondary | ICD-10-CM | POA: Diagnosis not present

## 2020-02-10 DIAGNOSIS — R079 Chest pain, unspecified: Secondary | ICD-10-CM

## 2020-02-10 DIAGNOSIS — R0602 Shortness of breath: Secondary | ICD-10-CM | POA: Diagnosis not present

## 2020-02-10 DIAGNOSIS — R059 Cough, unspecified: Secondary | ICD-10-CM | POA: Diagnosis not present

## 2020-02-10 MED ORDER — BENZONATATE 100 MG PO CAPS: 100.0000 mg | ORAL_CAPSULE | Freq: Three times a day (TID) | ORAL | 0 refills | Status: DC

## 2020-02-10 NOTE — Discharge Instructions (Signed)
Your chest xray is normal.    Take the Tessalon Perles as needed for cough.    Schedule a follow-up appointment with your primary care provider.

## 2020-02-10 NOTE — ED Triage Notes (Signed)
Pt c/o a cough and nasal congestion. Pt states this morning she woke up with a sharp chest pain that is localized and left arm numbness. Pt states her left shoulder was hurting and her left ear was aching. Pt states she was COVID positive 01/23/20.

## 2020-02-10 NOTE — ED Provider Notes (Signed)
[place]    CSN: [id number] Arrival date & time: 02/10/20  1550      History   Chief Complaint Chief Complaint  Patient presents with  . Cough  . Nasal Congestion       . Chest Pain  . Numbness  . Otalgia  . Shortness of Breath    HPI Susan Gill is a 50 y.o. female.  Patient presents with cough, shortness of breath, nasal congestion since testing positive for COVID on 01/23/2020. She states her symptoms are worse at night while laying down. She states she had an episode this morning of sharp chest pain which radiated to her left arm. No chest pain at this time. She denies fever, chills, vomiting, diarrhea, or other symptoms. Her medical history includes hypertension, asthma, pneumonia.  The history is provided by the patient and medical records.    Past Medical History:  Diagnosis Date  . Asthma   . Fibroids   . Hypertension   . Numbness of extremity    notices after waking up from sleep  . Pneumonia   . Sciatica     Patient Active Problem List   Diagnosis Date Noted  . Fibroids, intramural 05/09/2016  . Fibroids 02/09/2012  . Dysmenorrhea 02/09/2012  . Menorrhagia 02/09/2012    Past Surgical History:  Procedure Laterality Date  . CESAREAN SECTION    . CYSTOSCOPY N/A 05/09/2016   Procedure: CYSTOSCOPY;  Surgeon: Everett Graff, MD;  Location: [facility];  Service: Gynecology;  Laterality: N/A;  . HYSTERECTOMY ABDOMINAL WITH SALPINGECTOMY Bilateral 05/09/2016   Procedure: HYSTERECTOMY ABDOMINAL WITH SALPINGECTOMY;  Surgeon: Everett Graff, MD;  Location: [facility];  Service: Gynecology;  Laterality: Bilateral;  . LAPAROSCOPIC VAGINAL HYSTERECTOMY WITH SALPINGECTOMY Bilateral 05/09/2016   Procedure: ATTEMPTED TOTAL LAPAROSCOPIC  HYSTERECTOMY WITH SALPINGECTOMY;  Surgeon: Everett Graff, MD;  Location: [facility];  Service: Gynecology;  Laterality: Bilateral;  . TUBAL LIGATION      OB History    Gravida  2   Para  2   Term  2   Preterm       AB      Living  2     SAB      IAB      Ectopic      Multiple      Live Births               Home Medications    Prior to Admission medications   Medication Sig Start Date End Date Taking? Authorizing Provider  benzonatate (TESSALON) 100 MG capsule Take 1 capsule (100 mg total) by mouth every 8 (eight) hours. 02/10/20  Yes Sharion Balloon, NP  ALLEGRA-D ALLERGY & CONGESTION 60-120 MG 12 hr tablet Take 1 tablet by mouth 2 (two) times daily as needed for allergies. 03/26/16   [provider]  Azilsartan-Chlorthalidone (EDARBYCLOR) 40-12.5 MG TABS Take 0.5 tablets by mouth daily.     [provider]  cyclobenzaprine (FLEXERIL) 10 MG tablet Take 1 tablet (10 mg total) by mouth 2 (two) times daily as needed for muscle spasms. DO NOT DRINK ALCOHOL OR DRIVE WHILE TAKING THIS MEDICATION 11/04/19   Volney American, PA-C  D3-50 50000 units capsule Take 50,000 Units by mouth once a week. 02/23/16   [provider]  ferrous sulfate 325 (65 FE) MG tablet Take 325 mg by mouth 2 (two) times a week.    [provider]  meloxicam (MOBIC) 15 MG tablet Take 1 tablet (15 mg  total) by mouth daily as needed for pain. 11/04/19   Volney American, PA-C    Family History Family History  Problem Relation Age of Onset  . Cancer Mother        thyroid  . Diabetes Father   . Hypertension Maternal Grandmother   . Kidney disease Maternal Grandmother   . Diabetes Paternal Grandmother   . Diabetes Paternal Grandfather     Social History Social History   Tobacco Use  . Smoking status: Never Smoker  . Smokeless tobacco: Never Used  Vaping Use  . Vaping Use: Never used  Substance Use Topics  . Alcohol use: No  . Drug use: No     Allergies   Codeine, Dilaudid [hydromorphone hcl], Other, Peanut-containing drug products, and Percocet [oxycodone-acetaminophen]   Review of Systems Review of Systems  Constitutional: Negative for chills and fever.   HENT: Positive for congestion. Negative for ear pain and sore throat.   Eyes: Negative for pain and visual disturbance.  Respiratory: Positive for cough and shortness of breath.   Cardiovascular: Positive for chest pain. Negative for palpitations.  Gastrointestinal: Negative for abdominal pain and vomiting.  Genitourinary: Negative for dysuria and hematuria.  Musculoskeletal: Negative for arthralgias and back pain.  Skin: Negative for color change and rash.  Neurological: Negative for seizures and syncope.  All other systems reviewed and are negative.    Physical Exam Triage Vital Signs ED Triage Vitals  Enc Vitals Group     BP      Pulse      Resp      Temp      Temp src      SpO2      Weight      Height      Head Circumference      Peak Flow      Pain Score      Pain Loc      Pain Edu?      Excl. in [place]?    No data found.  Updated Vital Signs BP (!) 141/87 (BP Location: Left Arm)   Pulse 80   Temp 98.6 F (37 C) (Oral)   Resp 15   LMP 01/10/2016   SpO2 97%   Visual Acuity Right Eye Distance:   Left Eye Distance:   Bilateral Distance:    Right Eye Near:   Left Eye Near:    Bilateral Near:     Physical Exam Vitals and nursing note reviewed.  Constitutional:      General: She is not in acute distress.    Appearance: She is well-developed and well-nourished. She is obese. She is not ill-appearing.  HENT:     Head: Normocephalic and atraumatic.     Right Ear: Tympanic membrane normal.     Left Ear: Tympanic membrane normal.     Nose: Nose normal.     Mouth/Throat:     Mouth: Mucous membranes are moist.     Pharynx: Oropharynx is clear.  Eyes:     Conjunctiva/sclera: Conjunctivae normal.  Cardiovascular:     Rate and Rhythm: Normal rate and regular rhythm.     Heart sounds: Normal heart sounds.  Pulmonary:     Effort: Pulmonary effort is normal. No respiratory distress.     Breath sounds: Normal breath sounds.  Abdominal:     Palpations: Abdomen  is soft.     Tenderness: There is no abdominal tenderness. There is no guarding or rebound.  Musculoskeletal:  General: No edema.     Cervical back: Neck supple.  Skin:    General: Skin is warm and dry.     Findings: No rash.  Neurological:     General: No focal deficit present.     Mental Status: She is alert and oriented to person, place, and time.     Gait: Gait normal.  Psychiatric:        Mood and Affect: Mood and affect and mood normal.        Behavior: Behavior normal.      UC Treatments / Results  Labs (all labs ordered are listed, but only abnormal results are displayed) Labs Reviewed - No data to display  EKG   Radiology DG Chest 2 View  Result Date: 02/10/2020 CLINICAL DATA:  Cough, shortness of breath, COVID EXAM: CHEST - 2 VIEW COMPARISON:  11/14/2015 FINDINGS: The heart size and mediastinal contours are within normal limits. Both lungs are clear. The visualized skeletal structures are unremarkable. IMPRESSION: Normal study. Electronically Signed   By: Rolm Baptise M.D.   On: 02/10/2020 17:22    Procedures Procedures (including critical care time)  Medications Ordered in UC Medications - No data to display  Initial Impression / Assessment and Plan / UC Course  I have reviewed the triage vital signs and the nursing notes.  Pertinent labs & imaging results that were available during my care of the patient were reviewed by me and considered in my medical decision making (see chart for details).   Cough, SOB, chest pain.  EKG shows sinus rhythm, rate 78, no ST elevation, compared to previous from 2018.  Chest xray negative. Treating cough with Tessalon Perles. Instructed patient to follow-up with her PCP tomorrow. She agrees to plan of care.   Final Clinical Impressions(s) / UC Diagnoses   Final diagnoses:  Cough  Shortness of breath  Chest pain, unspecified type     Discharge Instructions     Your chest xray is normal.    Take the Tessalon  Perles as needed for cough.    Schedule a follow-up appointment with your primary care provider.        ED Prescriptions    Medication Sig Dispense Auth. Provider   benzonatate (TESSALON) 100 MG capsule Take 1 capsule (100 mg total) by mouth every 8 (eight) hours. 21 capsule Sharion Balloon, NP     PDMP not reviewed this encounter.   Sharion Balloon, NP 02/10/20 [phone]

## 2020-06-29 ENCOUNTER — Other Ambulatory Visit: Payer: Self-pay

## 2020-06-29 ENCOUNTER — Emergency Department (HOSPITAL_BASED_OUTPATIENT_CLINIC_OR_DEPARTMENT_OTHER)
Admission: EM | Admit: 2020-06-29 | Discharge: 2020-06-29 | Disposition: A | Discharge status: Home / Self Care | Payer: BC Managed Care – PPO | Attending: Emergency Medicine | Admitting: Emergency Medicine

## 2020-06-29 ENCOUNTER — Encounter (HOSPITAL_BASED_OUTPATIENT_CLINIC_OR_DEPARTMENT_OTHER): Payer: Self-pay | Admitting: Obstetrics and Gynecology

## 2020-06-29 DIAGNOSIS — R350 Frequency of micturition: Secondary | ICD-10-CM | POA: Insufficient documentation

## 2020-06-29 DIAGNOSIS — I10 Essential (primary) hypertension: Secondary | ICD-10-CM | POA: Diagnosis not present

## 2020-06-29 DIAGNOSIS — R739 Hyperglycemia, unspecified: Secondary | ICD-10-CM | POA: Diagnosis not present

## 2020-06-29 DIAGNOSIS — R7303 Prediabetes: Secondary | ICD-10-CM | POA: Diagnosis not present

## 2020-06-29 DIAGNOSIS — J45909 Unspecified asthma, uncomplicated: Secondary | ICD-10-CM | POA: Diagnosis not present

## 2020-06-29 DIAGNOSIS — Z91010 Allergy to peanuts: Secondary | ICD-10-CM | POA: Diagnosis not present

## 2020-06-29 DIAGNOSIS — Z8616 Personal history of COVID-19: Secondary | ICD-10-CM | POA: Insufficient documentation

## 2020-06-29 DIAGNOSIS — Z79899 Other long term (current) drug therapy: Secondary | ICD-10-CM | POA: Insufficient documentation

## 2020-06-29 LAB — CBC
HCT: 44.9 % (ref 36.0–46.0)
Hemoglobin: 15.1 g/dL — ABNORMAL HIGH (ref 12.0–15.0)
MCH: 30.9 pg (ref 26.0–34.0)
MCHC: 33.6 g/dL (ref 30.0–36.0)
MCV: 92 fL (ref 80.0–100.0)
Platelets: 320 10*3/uL (ref 150–400)
RBC: 4.88 MIL/uL (ref 3.87–5.11)
RDW: 12.1 % (ref 11.5–15.5)
WBC: 7.6 10*3/uL (ref 4.0–10.5)
nRBC: 0 % (ref 0.0–0.2)

## 2020-06-29 LAB — URINALYSIS, ROUTINE W REFLEX MICROSCOPIC
Bilirubin Urine: NEGATIVE
Glucose, UA: >1000 mg/dL — AB
Hgb urine dipstick: NEGATIVE
Ketones, ur: 40 mg/dL — AB
Nitrite: NEGATIVE
Protein, ur: NEGATIVE mg/dL
Specific Gravity, Urine: 1.03 (ref 1.005–1.030)
pH: 5.5 (ref 5.0–8.0)

## 2020-06-29 LAB — BASIC METABOLIC PANEL
Anion gap: 11 (ref 5–15)
BUN: 11 mg/dL (ref 6–20)
CO2: 24 mmol/L (ref 22–32)
Calcium: 10.1 mg/dL (ref 8.9–10.3)
Chloride: 98 mmol/L (ref 98–111)
Creatinine, Ser: 0.77 mg/dL (ref 0.44–1.00)
GFR, Estimated: >60 mL/min (ref >60)
Glucose, Bld: 323 mg/dL — ABNORMAL HIGH (ref 70–99)
Potassium: 4.3 mmol/L (ref 3.5–5.1)
Sodium: 133 mmol/L — ABNORMAL LOW (ref 135–145)

## 2020-06-29 LAB — PREGNANCY, URINE: Preg Test, Ur: NEGATIVE

## 2020-06-29 MED ORDER — METFORMIN HCL 1000 MG PO TABS: 1000.0000 mg | ORAL_TABLET | Freq: Two times a day (BID) | ORAL | 0 refills | Status: DC

## 2020-06-29 MED ORDER — METFORMIN HCL 500 MG PO TABS
1000.0000 mg | ORAL_TABLET | Freq: Once | ORAL | Status: AC
  Administered 2020-06-29: 13:00:00 1000 mg via ORAL
  Filled 2020-06-29: qty 2

## 2020-06-29 MED ORDER — SODIUM CHLORIDE 0.9 % IV BOLUS: 1000.0000 mL | Freq: Once | INTRAVENOUS | Status: DC

## 2020-06-29 NOTE — Discharge Instructions (Addendum)
Please take medications as prescribed Please see your doctor this week for recheck of blood sugar Return if you are having nausea, vomiting, inability tolerate fluids or other new symptoms.

## 2020-06-29 NOTE — ED Triage Notes (Signed)
Patient reports to the ER for complaint of UTI symptoms as well as URI symptoms such as congestion and sore throat. Patient endorses vaginal itching and urinary frequency

## 2020-06-29 NOTE — ED Provider Notes (Signed)
[facility] Provider Note   CSN: [id number] Arrival date & time: 06/29/20  1001     History Chief Complaint  Patient presents with   Urinary Frequency   URI    Susan Gill is a 50 y.o. female.  HPI 50 year old female history of prediabetes, hypertension, presents today complaining of urinary frequency over the past 2 weeks.  She ports that she has been on medication for diabetes but has not been taking it for several months to the fact that it makes her nauseated.  She has not checking her home blood sugars.  She endorses polydipsia.  She has some burning with urination and a thin grayish vaginal discharge.  She denies pregnancy.  She states she has had a hysterectomy although she has her ovaries.  She no longer has regular periods.  She has not had fever, chills, nausea, vomiting, diarrhea, or cough.  She had COVID in January.  She had completed her vaccine series and had a booster.    Past Medical History:  Diagnosis Date   Asthma    Fibroids    Hypertension    Numbness of extremity    notices after waking up from sleep   Pneumonia    Sciatica     Patient Active Problem List   Diagnosis Date Noted   Fibroids, intramural 05/09/2016   Fibroids 02/09/2012   Dysmenorrhea 02/09/2012   Menorrhagia 02/09/2012    Past Surgical History:  Procedure Laterality Date   CESAREAN SECTION     CYSTOSCOPY N/A 05/09/2016   Procedure: CYSTOSCOPY;  Surgeon: Everett Graff, MD;  Location: [facility];  Service: Gynecology;  Laterality: N/A;   HYSTERECTOMY ABDOMINAL WITH SALPINGECTOMY Bilateral 05/09/2016   Procedure: HYSTERECTOMY ABDOMINAL WITH SALPINGECTOMY;  Surgeon: Everett Graff, MD;  Location: [facility];  Service: Gynecology;  Laterality: Bilateral;   LAPAROSCOPIC VAGINAL HYSTERECTOMY WITH SALPINGECTOMY Bilateral 05/09/2016   Procedure: ATTEMPTED TOTAL LAPAROSCOPIC  HYSTERECTOMY WITH SALPINGECTOMY;  Surgeon: Everett Graff, MD;  Location: [facility];   Service: Gynecology;  Laterality: Bilateral;   TUBAL LIGATION       OB History     Gravida  2   Para  2   Term  2   Preterm      AB      Living  2      SAB      IAB      Ectopic      Multiple      Live Births              Family History  Problem Relation Age of Onset   Cancer Mother        thyroid   Diabetes Father    Hypertension Maternal Grandmother    Kidney disease Maternal Grandmother    Diabetes Paternal Grandmother    Diabetes Paternal Grandfather     Social History   Tobacco Use   Smoking status: Never   Smokeless tobacco: Never  Vaping Use   Vaping Use: Never used  Substance Use Topics   Alcohol use: No   Drug use: No    Home Medications Prior to Admission medications   Medication Sig Start Date End Date Taking? Authorizing Provider  ALLEGRA-D ALLERGY & CONGESTION 60-120 MG 12 hr tablet Take 1 tablet by mouth 2 (two) times daily as needed for allergies. 03/26/16   [provider]  Azilsartan-Chlorthalidone (EDARBYCLOR) 40-12.5 MG TABS Take 0.5 tablets by mouth daily.     [provider]  benzonatate (  TESSALON) 100 MG capsule Take 1 capsule (100 mg total) by mouth every 8 (eight) hours. 02/10/20   Sharion Balloon, NP  cyclobenzaprine (FLEXERIL) 10 MG tablet Take 1 tablet (10 mg total) by mouth 2 (two) times daily as needed for muscle spasms. DO NOT DRINK ALCOHOL OR DRIVE WHILE TAKING THIS MEDICATION 11/04/19   Volney American, PA-C  D3-50 50000 units capsule Take 50,000 Units by mouth once a week. 02/23/16   [provider]  ferrous sulfate 325 (65 FE) MG tablet Take 325 mg by mouth 2 (two) times a week.    [provider]  meloxicam (MOBIC) 15 MG tablet Take 1 tablet (15 mg total) by mouth daily as needed for pain. 11/04/19   Volney American, PA-C    Allergies    Codeine, Dilaudid [hydromorphone hcl], Other, Peanut-containing drug products, and Percocet [oxycodone-acetaminophen]  Review of  Systems   Review of Systems  Constitutional: Negative.   HENT:  Positive for sore throat.   Gastrointestinal: Negative.   Endocrine: Positive for polydipsia and polyuria.  All other systems reviewed and are negative.  Physical Exam Updated Vital Signs BP 122/65 (BP Location: Right Arm)   Pulse 95   Temp (!) 97.4 F (36.3 C) (Tympanic)   Resp 16   Ht 1.651 m (5\' 5" )   Wt 112 kg   LMP 01/10/2016   SpO2 96%   BMI 41.09 kg/m   Physical Exam Vitals and nursing note reviewed.  Constitutional:      General: She is not in acute distress.    Appearance: She is well-developed.  HENT:     Head: Normocephalic and atraumatic.     Right Ear: External ear normal.     Left Ear: External ear normal.     Nose: Nose normal.     Mouth/Throat:     Mouth: Mucous membranes are moist.     Pharynx: Oropharynx is clear.  Eyes:     Conjunctiva/sclera: Conjunctivae normal.     Pupils: Pupils are equal, round, and reactive to light.  Cardiovascular:     Rate and Rhythm: Normal rate.  Pulmonary:     Effort: Pulmonary effort is normal.  Abdominal:     General: Abdomen is flat.     Palpations: Abdomen is soft.  Musculoskeletal:        General: Normal range of motion.     Cervical back: Normal range of motion and neck supple.  Skin:    General: Skin is warm and dry.  Neurological:     Mental Status: She is alert and oriented to person, place, and time.     Motor: No abnormal muscle tone.     Coordination: Coordination normal.  Psychiatric:        Behavior: Behavior normal.        Thought Content: Thought content normal.    ED Results / Procedures / Treatments   Labs (all labs ordered are listed, but only abnormal results are displayed) Labs Reviewed  URINALYSIS, ROUTINE W REFLEX MICROSCOPIC  PREGNANCY, URINE    EKG None  Radiology No results found.  Procedures Procedures   Medications Ordered in ED Medications - No data to display  ED Course  I have reviewed the triage  vital signs and the nursing notes.  Pertinent labs & imaging results that were available during my care of the patient were reviewed by me and considered in my medical decision making (see chart for details).    MDM Rules/Calculators/A&P  50 year old female with known hyperglycemia presents today with polyuria and polydipsia.  Blood sugar here over 300.  No evidence of DKA.  No evidence of UTI.  Plan to start metformin.  Patient cautioned regarding need for close follow-up and return precautions and voices understanding. Final Clinical Impression(s) / ED Diagnoses Final diagnoses:  Hyperglycemia    Rx / DC Orders ED Discharge Orders     None        Pattricia Boss, MD 06/29/20 1251

## 2020-12-09 ENCOUNTER — Encounter (HOSPITAL_BASED_OUTPATIENT_CLINIC_OR_DEPARTMENT_OTHER): Payer: Self-pay | Admitting: *Deleted

## 2020-12-09 ENCOUNTER — Emergency Department (HOSPITAL_BASED_OUTPATIENT_CLINIC_OR_DEPARTMENT_OTHER)
Admission: EM | Admit: 2020-12-09 | Discharge: 2020-12-09 | Disposition: A | Discharge status: Home / Self Care | Payer: BC Managed Care – PPO | Attending: Emergency Medicine | Admitting: Emergency Medicine

## 2020-12-09 ENCOUNTER — Other Ambulatory Visit: Payer: Self-pay

## 2020-12-09 DIAGNOSIS — I10 Essential (primary) hypertension: Secondary | ICD-10-CM | POA: Insufficient documentation

## 2020-12-09 DIAGNOSIS — Z91010 Allergy to peanuts: Secondary | ICD-10-CM | POA: Diagnosis not present

## 2020-12-09 DIAGNOSIS — J45909 Unspecified asthma, uncomplicated: Secondary | ICD-10-CM | POA: Diagnosis not present

## 2020-12-09 DIAGNOSIS — H00024 Hordeolum internum left upper eyelid: Secondary | ICD-10-CM | POA: Diagnosis not present

## 2020-12-09 DIAGNOSIS — H5712 Ocular pain, left eye: Secondary | ICD-10-CM | POA: Diagnosis present

## 2020-12-09 MED ORDER — DOXYCYCLINE HYCLATE 100 MG PO CAPS: 100.0000 mg | ORAL_CAPSULE | Freq: Two times a day (BID) | ORAL | 0 refills | Status: AC

## 2020-12-09 NOTE — ED Triage Notes (Signed)
Left eye pain, itchy, teary started Saturday.  Left eye swelling started yesterday.

## 2020-12-09 NOTE — Discharge Instructions (Signed)
Read the instructions provided on stye.  You have an internal hordeolum or internal stye -which is typically self-limiting in 7-10 days with warm compresses.  Apply 5-10 minutes of warm compresses 4 times a day over the affected eye.  Avoid make-up, irritants that can cause more inflammation.  Return to the ER if there is swelling around all of your eye, pain in your eye, vision change, pain with light sensation in the eye, pain with movement of the eye.  We have prescribed you antibiotics, start taking the antibiotics only if you start having increased swelling and yellow/green purulent drainage around the eyelids.  Follow-up with your primary care doctor in 1 week if not getting better.

## 2020-12-09 NOTE — ED Provider Notes (Signed)
[facility] Provider Note   CSN: [id number] Arrival date & time: 12/09/20  0757     History Chief Complaint  Patient presents with   Eye Pain    Susan Gill is a 50 y.o. female.  HPI     50 year old female comes in with chief complaint of eye pain. Patient reports that she started having some irritation to her eye on Saturday.  Today she woke up and noted swelling around her left eyelid.  Her son got married over the weekend, there was make-up applied at that time.  Patient is diabetic.  Denies any fevers, chills.  There was some clear drainage to her eyelids this morning.  Past Medical History:  Diagnosis Date   Asthma    Fibroids    Hypertension    Numbness of extremity    notices after waking up from sleep   Pneumonia    Sciatica     Patient Active Problem List   Diagnosis Date Noted   Fibroids, intramural 05/09/2016   Fibroids 02/09/2012   Dysmenorrhea 02/09/2012   Menorrhagia 02/09/2012    Past Surgical History:  Procedure Laterality Date   CESAREAN SECTION     CYSTOSCOPY N/A 05/09/2016   Procedure: CYSTOSCOPY;  Surgeon: Everett Graff, MD;  Location: [facility];  Service: Gynecology;  Laterality: N/A;   HYSTERECTOMY ABDOMINAL WITH SALPINGECTOMY Bilateral 05/09/2016   Procedure: HYSTERECTOMY ABDOMINAL WITH SALPINGECTOMY;  Surgeon: Everett Graff, MD;  Location: [facility];  Service: Gynecology;  Laterality: Bilateral;   LAPAROSCOPIC VAGINAL HYSTERECTOMY WITH SALPINGECTOMY Bilateral 05/09/2016   Procedure: ATTEMPTED TOTAL LAPAROSCOPIC  HYSTERECTOMY WITH SALPINGECTOMY;  Surgeon: Everett Graff, MD;  Location: [facility];  Service: Gynecology;  Laterality: Bilateral;   TUBAL LIGATION       OB History     Gravida  2   Para  2   Term  2   Preterm      AB      Living  2      SAB      IAB      Ectopic      Multiple      Live Births              Family History  Problem Relation Age of Onset   Cancer Mother         thyroid   Diabetes Father    Hypertension Maternal Grandmother    Kidney disease Maternal Grandmother    Diabetes Paternal Grandmother    Diabetes Paternal Grandfather     Social History   Tobacco Use   Smoking status: Never   Smokeless tobacco: Never  Vaping Use   Vaping Use: Never used  Substance Use Topics   Alcohol use: No   Drug use: No    Home Medications Prior to Admission medications   Medication Sig Start Date End Date Taking? Authorizing Provider  doxycycline (VIBRAMYCIN) 100 MG capsule Take 1 capsule (100 mg total) by mouth 2 (two) times daily. 12/09/20  Yes Lovelace Cerveny, MD  TRIJARDY XR 12.5-2.05-998 MG TB24 Take by mouth. 09/16/20   [provider]    Allergies    Codeine, Dilaudid [hydromorphone hcl], Other, Peanut-containing drug products, and Percocet [oxycodone-acetaminophen]  Review of Systems   Review of Systems  Constitutional:  Negative for activity change.  Eyes:  Negative for photophobia, pain, discharge, redness and visual disturbance.  Skin:  Positive for rash.  Neurological:  Negative for headaches.   Physical Exam Updated Vital Signs BP Marland Kitchen)  147/86 (BP Location: Right Arm)   Pulse 81   Temp 97.9 F (36.6 C) (Oral)   Resp 16   Ht 5\' 5"  (1.651 m)   Wt 111.1 kg   LMP 01/10/2016   SpO2 98%   BMI 40.77 kg/m   Physical Exam Vitals and nursing note reviewed.  HENT:     Head: Normocephalic.  Eyes:     Extraocular Movements: Extraocular movements intact.     Pupils: Pupils are equal, round, and reactive to light.     Comments: Left eye lid has internal pustular nodule There is slight discharge around the eyelashes over the lateral aspect of the eye  Cardiovascular:     Rate and Rhythm: Normal rate.  Neurological:     Mental Status: She is alert.    ED Results / Procedures / Treatments   Labs (all labs ordered are listed, but only abnormal results are displayed) Labs Reviewed - No data to  display  EKG None  Radiology No results found.  Procedures Procedures   Medications Ordered in ED Medications - No data to display  ED Course  I have reviewed the triage vital signs and the nursing notes.  Pertinent labs & imaging results that were available during my care of the patient were reviewed by me and considered in my medical decision making (see chart for details).    MDM Rules/Calculators/A&P                           50 year old female comes in with chief complaint of eye pain.  She likely has a stye, but there is some drainage around the eyelid and eyelashes on the left side as well.  She is diabetic.  The swelling got worse today.  There is no evidence of preseptal cellulitis or orbital cellulitis on exam, but in addition to the warm compresses, she has been advised to use doxycycline if the redness and drainage gets worse.  Strict ER return precautions also discussed with preorbital cellulitis and orbital cellulitis in mind.  Advised follow-up with PCP in 1 week.  If the lesion does not clear, then it might need ophthalmology consultation.   Final Clinical Impression(s) / ED Diagnoses Final diagnoses:  Hordeolum internum of left upper eyelid    Rx / DC Orders ED Discharge Orders          Ordered    doxycycline (VIBRAMYCIN) 100 MG capsule  2 times daily        12/09/20 0916             Varney Biles, MD 12/09/20 [phone]

## 2020-12-14 ENCOUNTER — Other Ambulatory Visit: Payer: Self-pay

## 2020-12-14 ENCOUNTER — Emergency Department (HOSPITAL_BASED_OUTPATIENT_CLINIC_OR_DEPARTMENT_OTHER): Payer: BC Managed Care – PPO | Admitting: Radiology

## 2020-12-14 ENCOUNTER — Encounter (HOSPITAL_BASED_OUTPATIENT_CLINIC_OR_DEPARTMENT_OTHER): Payer: Self-pay | Admitting: Emergency Medicine

## 2020-12-14 ENCOUNTER — Emergency Department (HOSPITAL_BASED_OUTPATIENT_CLINIC_OR_DEPARTMENT_OTHER)
Admission: EM | Admit: 2020-12-14 | Discharge: 2020-12-14 | Disposition: A | Discharge status: Home / Self Care | Payer: BC Managed Care – PPO | Attending: Emergency Medicine | Admitting: Emergency Medicine

## 2020-12-14 DIAGNOSIS — Z20822 Contact with and (suspected) exposure to covid-19: Secondary | ICD-10-CM | POA: Insufficient documentation

## 2020-12-14 DIAGNOSIS — Z91010 Allergy to peanuts: Secondary | ICD-10-CM | POA: Insufficient documentation

## 2020-12-14 DIAGNOSIS — R0789 Other chest pain: Secondary | ICD-10-CM

## 2020-12-14 DIAGNOSIS — J101 Influenza due to other identified influenza virus with other respiratory manifestations: Secondary | ICD-10-CM | POA: Insufficient documentation

## 2020-12-14 DIAGNOSIS — J45909 Unspecified asthma, uncomplicated: Secondary | ICD-10-CM | POA: Insufficient documentation

## 2020-12-14 DIAGNOSIS — I10 Essential (primary) hypertension: Secondary | ICD-10-CM | POA: Insufficient documentation

## 2020-12-14 LAB — CBC
HCT: 45.5 % (ref 36.0–46.0)
Hemoglobin: 14.9 g/dL (ref 12.0–15.0)
MCH: 29.9 pg (ref 26.0–34.0)
MCHC: 32.7 g/dL (ref 30.0–36.0)
MCV: 91.2 fL (ref 80.0–100.0)
Platelets: 400 10*3/uL (ref 150–400)
RBC: 4.99 MIL/uL (ref 3.87–5.11)
RDW: 12.4 % (ref 11.5–15.5)
WBC: 10 10*3/uL (ref 4.0–10.5)
nRBC: 0 % (ref 0.0–0.2)

## 2020-12-14 LAB — BASIC METABOLIC PANEL
Anion gap: 11 (ref 5–15)
BUN: 12 mg/dL (ref 6–20)
CO2: 21 mmol/L — ABNORMAL LOW (ref 22–32)
Calcium: 10 mg/dL (ref 8.9–10.3)
Chloride: 105 mmol/L (ref 98–111)
Creatinine, Ser: 0.67 mg/dL (ref 0.44–1.00)
GFR, Estimated: >60 mL/min (ref >60)
Glucose, Bld: 94 mg/dL (ref 70–99)
Potassium: 4.1 mmol/L (ref 3.5–5.1)
Sodium: 137 mmol/L (ref 135–145)

## 2020-12-14 LAB — RESP PANEL BY RT-PCR (FLU A&B, COVID) ARPGX2
Influenza A by PCR: POSITIVE — AB
Influenza B by PCR: NEGATIVE
SARS Coronavirus 2 by RT PCR: NEGATIVE

## 2020-12-14 LAB — TROPONIN I (HIGH SENSITIVITY): Troponin I (High Sensitivity): 3 ng/L (ref <18)

## 2020-12-14 NOTE — ED Provider Notes (Signed)
[facility] EMERGENCY DEPT Provider Note   CSN: [id number] Arrival date & time: 12/14/20  1736     History Chief Complaint  Patient presents with   Chest Pain    Susan Gill is a 50 y.o. female.  HPI     This is a 50 year old female with a history of hypertension and asthma who presents with body aches, left-sided chest and arm pain, nonproductive cough.  Patient states that she has had symptoms since last weekend.  She describes pain in the left side of the chest mostly when she coughs.  Cough is nonproductive.  She describes body aches and chills.  No documented fevers.  No known sick contacts.  No nausea or vomiting.  Currently she is not having any active pain.  Past Medical History:  Diagnosis Date   Asthma    Fibroids    Hypertension    Numbness of extremity    notices after waking up from sleep   Pneumonia    Sciatica     Patient Active Problem List   Diagnosis Date Noted   Fibroids, intramural 05/09/2016   Fibroids 02/09/2012   Dysmenorrhea 02/09/2012   Menorrhagia 02/09/2012    Past Surgical History:  Procedure Laterality Date   CESAREAN SECTION     CYSTOSCOPY N/A 05/09/2016   Procedure: CYSTOSCOPY;  Surgeon: Everett Graff, MD;  Location: [facility];  Service: Gynecology;  Laterality: N/A;   HYSTERECTOMY ABDOMINAL WITH SALPINGECTOMY Bilateral 05/09/2016   Procedure: HYSTERECTOMY ABDOMINAL WITH SALPINGECTOMY;  Surgeon: Everett Graff, MD;  Location: [facility];  Service: Gynecology;  Laterality: Bilateral;   LAPAROSCOPIC VAGINAL HYSTERECTOMY WITH SALPINGECTOMY Bilateral 05/09/2016   Procedure: ATTEMPTED TOTAL LAPAROSCOPIC  HYSTERECTOMY WITH SALPINGECTOMY;  Surgeon: Everett Graff, MD;  Location: [facility];  Service: Gynecology;  Laterality: Bilateral;   TUBAL LIGATION       OB History     Gravida  2   Para  2   Term  2   Preterm      AB      Living  2      SAB      IAB      Ectopic      Multiple      Live Births               Family History  Problem Relation Age of Onset   Cancer Mother        thyroid   Diabetes Father    Hypertension Maternal Grandmother    Kidney disease Maternal Grandmother    Diabetes Paternal Grandmother    Diabetes Paternal Grandfather     Social History   Tobacco Use   Smoking status: Never   Smokeless tobacco: Never  Vaping Use   Vaping Use: Never used  Substance Use Topics   Alcohol use: No   Drug use: No    Home Medications Prior to Admission medications   Medication Sig Start Date End Date Taking? Authorizing Provider  doxycycline (VIBRAMYCIN) 100 MG capsule Take 1 capsule (100 mg total) by mouth 2 (two) times daily. 12/09/20   Varney Biles, MD  TRIJARDY XR 12.5-2.05-998 MG TB24 Take by mouth. 09/16/20   [provider]    Allergies    Codeine, Dilaudid [hydromorphone hcl], Other, Peanut-containing drug products, and Percocet [oxycodone-acetaminophen]  Review of Systems   Review of Systems  Constitutional:  Positive for chills. Negative for fever.  HENT:  Negative for congestion.   Respiratory:  Negative for shortness of breath.  Cardiovascular:  Positive for chest pain. Negative for leg swelling.  Gastrointestinal:  Negative for abdominal pain.  Genitourinary:  Negative for dysuria.  Musculoskeletal:  Positive for myalgias.  All other systems reviewed and are negative.  Physical Exam Updated Vital Signs BP 118/87 (BP Location: Right Arm)   Pulse 74   Temp 97.8 F (36.6 C) (Oral)   Resp 16   Ht 1.651 m (5\' 5" )   Wt 108.9 kg   LMP 01/10/2016   SpO2 100%   BMI 39.94 kg/m   Physical Exam Vitals and nursing note reviewed.  Constitutional:      Appearance: She is well-developed. She is obese. She is not ill-appearing.  HENT:     Head: Normocephalic and atraumatic.  Eyes:     Pupils: Pupils are equal, round, and reactive to light.  Cardiovascular:     Rate and Rhythm: Normal rate and regular rhythm.     Heart sounds: Normal  heart sounds.  Pulmonary:     Effort: Pulmonary effort is normal. No respiratory distress.     Breath sounds: No wheezing.  Abdominal:     General: Bowel sounds are normal.     Palpations: Abdomen is soft.  Musculoskeletal:     Cervical back: Neck supple.     Right lower leg: No tenderness. No edema.     Left lower leg: No tenderness. No edema.  Skin:    General: Skin is warm and dry.  Neurological:     Mental Status: She is alert and oriented to person, place, and time.  Psychiatric:        Mood and Affect: Mood normal.    ED Results / Procedures / Treatments   Labs (all labs ordered are listed, but only abnormal results are displayed) Labs Reviewed  RESP PANEL BY RT-PCR (FLU A&B, COVID) ARPGX2 - Abnormal; Notable for the following components:      Result Value   Influenza A by PCR POSITIVE (*)    All other components within normal limits  BASIC METABOLIC PANEL - Abnormal; Notable for the following components:   CO2 21 (*)    All other components within normal limits  CBC  TROPONIN I (HIGH SENSITIVITY)  TROPONIN I (HIGH SENSITIVITY)    EKG EKG Interpretation  Date/Time:  Tuesday December 14 2020 17:50:55 EST Ventricular Rate:  84 PR Interval:  154 QRS Duration: 78 QT Interval:  364 QTC Calculation: 430 R Axis:   -42 Text Interpretation: Normal sinus rhythm Left axis deviation Minimal voltage criteria for LVH, may be normal variant ( R in aVL ) Abnormal ECG Confirmed by Thayer Jew [phone]) on 12/14/2020 10:59:24 PM  Radiology DG Chest 2 View  Result Date: 12/14/2020 CLINICAL DATA:  Chest pain. EXAM: CHEST - 2 VIEW COMPARISON:  February 10, 2020 FINDINGS: The heart size and mediastinal contours are within normal limits. Both lungs are clear. The visualized skeletal structures are unremarkable. IMPRESSION: No active cardiopulmonary disease. Electronically Signed   By: Virgina Norfolk M.D.   On: 12/14/2020 18:56    Procedures Procedures   Medications Ordered  in ED Medications - No data to display  ED Course  I have reviewed the triage vital signs and the nursing notes.  Pertinent labs & imaging results that were available during my care of the patient were reviewed by me and considered in my medical decision making (see chart for details).    MDM Rules/Calculators/A&P  Patient presents with chest pain and cough.  She is nontoxic and vital signs are reassuring.  She is afebrile here.  Given upper respiratory symptoms, influenza testing was sent.  EKG shows no evidence of acute ST elevation or ischemia.  Troponin x1 is negative.  Influenza testing is positive.  Given cough and pain with coughing, highly suspect that her chest discomfort is related to acute influenza.  Chest x-ray shows no evidence of pneumothorax or pneumonia.  Otherwise labs are reassuring.  Do not feel she needs repeat troponin as her presentation is atypical for ACS.  She is out of the window for Tamiflu.  Recommend ibuprofen and Tylenol for body aches and pains.  Discussed hydration.  After history, exam, and medical workup I feel the patient has been appropriately medically screened and is safe for discharge home. Pertinent diagnoses were discussed with the patient. Patient was given return precautions.  Final Clinical Impression(s) / ED Diagnoses Final diagnoses:  Influenza A  Atypical chest pain    Rx / DC Orders ED Discharge Orders     None        Merryl Hacker, MD 12/14/20 2336

## 2020-12-14 NOTE — ED Triage Notes (Signed)
Pt arrives pov, ambulatory to triage, c/o radiating left side CP  to left arm and left side of neck. Pt reports left arm numbness upon waking this am. Pt AOx4, also reports cough for a few days.

## 2020-12-14 NOTE — Discharge Instructions (Signed)
You were seen today for body aches and chest discomfort.  Your chest pain work-up is reassuring.  You tested positive for influenza.  This is likely the cause of your symptoms.  You are out of the window for Tamiflu.  Take Tylenol or ibuprofen as needed for body aches and pains.  Make sure that you are staying hydrated.

## 2021-10-18 ENCOUNTER — Other Ambulatory Visit (HOSPITAL_COMMUNITY): Payer: Self-pay

## 2021-10-18 MED ORDER — OZEMPIC (1 MG/DOSE) 4 MG/3ML ~~LOC~~ SOPN
1.0000 mg | PEN_INJECTOR | SUBCUTANEOUS | 1 refills | Status: AC
  Filled 2021-10-18: qty 9, 84d supply, fill #0

## 2021-10-19 ENCOUNTER — Other Ambulatory Visit (HOSPITAL_COMMUNITY): Payer: Self-pay

## 2022-02-13 ENCOUNTER — Other Ambulatory Visit: Payer: Self-pay | Admitting: Obstetrics and Gynecology

## 2022-02-13 DIAGNOSIS — N644 Mastodynia: Secondary | ICD-10-CM

## 2022-02-17 ENCOUNTER — Ambulatory Visit (HOSPITAL_COMMUNITY)
Admission: RE | Admit: 2022-02-17 | Discharge: 2022-02-17 | Disposition: A | Discharge status: Home / Self Care | Payer: BC Managed Care – PPO | Source: Ambulatory Visit | Attending: Internal Medicine | Admitting: Internal Medicine

## 2022-02-17 ENCOUNTER — Encounter (HOSPITAL_COMMUNITY): Payer: Self-pay

## 2022-02-17 VITALS — BP 154/92 | HR 75 | Temp 98.4°F | Resp 18

## 2022-02-17 DIAGNOSIS — Z1152 Encounter for screening for COVID-19: Secondary | ICD-10-CM | POA: Insufficient documentation

## 2022-02-17 DIAGNOSIS — J069 Acute upper respiratory infection, unspecified: Secondary | ICD-10-CM | POA: Diagnosis not present

## 2022-02-17 DIAGNOSIS — R200 Anesthesia of skin: Secondary | ICD-10-CM | POA: Insufficient documentation

## 2022-02-17 DIAGNOSIS — R059 Cough, unspecified: Secondary | ICD-10-CM | POA: Insufficient documentation

## 2022-02-17 DIAGNOSIS — R0789 Other chest pain: Secondary | ICD-10-CM | POA: Diagnosis not present

## 2022-02-17 MED ORDER — PROMETHAZINE-DM 6.25-15 MG/5ML PO SYRP: 5.0000 mL | ORAL_SOLUTION | Freq: Every evening | ORAL | 0 refills | Status: AC | PRN

## 2022-02-17 MED ORDER — BENZONATATE 100 MG PO CAPS: 100.0000 mg | ORAL_CAPSULE | Freq: Three times a day (TID) | ORAL | 0 refills | Status: AC

## 2022-02-17 NOTE — ED Provider Notes (Signed)
[facility]    CSN: [id number] Arrival date & time: 02/17/22  1051      History   Chief Complaint Chief Complaint  Patient presents with  . appt 11  . Back Pain    HPI Susan Gill is a 52 y.o. female.   Patient presents to urgent care for evaluation of cough, nasal congestion, bilateral ear pruritus, sore throat, and bodyaches for the last 2 days since Wednesday, February 15, 2021.  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  She also complains of left-sided chest pain with associated left arm/shoulder pain that started a couple of days ago as well.  She states the pain is constant and to the center of her chest that sometimes radiates to the generalized chest wall.  Intense coughing fits makes the chest pain worse.  She has a history of pneumonia and describes her cough as feeling similar to the cough she had when she had pneumonia when the cough was improving.  She states her cough is dry but sometimes productive with phlegm.  No recent fever, chills, shortness of breath, heart palpitations, or dizziness.  She has a history of asthma, no other chronic respiratory problems reported. She is not a smoker and denies drug use. She also reports right second and third digit numbness upon waking for several weeks. She denies history of carpal tunnel syndrome, history of injuries to the left wrist, and constant wrist pain. She is right hand dominant and denies recent injuries/trauma to the left hand, wrist, or arm. No temperature or color change to the upper extremities. States "whenever anything hits the left wrist, intense sharp shooting pain is sent to the left hand/finger." She has not attempted use of any over the counter medications or wrist braces/splints prior to arrival at urgent care for symptoms.    Back Pain   Past Medical History:  Diagnosis Date  . Asthma   . Fibroids   . Hypertension   .  Numbness of extremity    notices after waking up from sleep  . Pneumonia   . Sciatica     Patient Active Problem List   Diagnosis Date Noted  . Fibroids, intramural 05/09/2016  . Fibroids 02/09/2012  . Dysmenorrhea 02/09/2012  . Menorrhagia 02/09/2012    Past Surgical History:  Procedure Laterality Date  . CESAREAN SECTION    . CYSTOSCOPY N/A 05/09/2016   Procedure: CYSTOSCOPY;  Surgeon: Everett Graff, MD;  Location: [facility];  Service: Gynecology;  Laterality: N/A;  . HYSTERECTOMY ABDOMINAL WITH SALPINGECTOMY Bilateral 05/09/2016   Procedure: HYSTERECTOMY ABDOMINAL WITH SALPINGECTOMY;  Surgeon: Everett Graff, MD;  Location: [facility];  Service: Gynecology;  Laterality: Bilateral;  . LAPAROSCOPIC VAGINAL HYSTERECTOMY WITH SALPINGECTOMY Bilateral 05/09/2016   Procedure: ATTEMPTED TOTAL LAPAROSCOPIC  HYSTERECTOMY WITH SALPINGECTOMY;  Surgeon: Everett Graff, MD;  Location: [facility];  Service: Gynecology;  Laterality: Bilateral;  . TUBAL LIGATION      OB History     Gravida  2   Para  2   Term  2   Preterm      AB      Living  2      SAB      IAB      Ectopic      Multiple      Live Births               Home Medications    Prior  to Admission medications   Medication Sig Start Date End Date Taking? Authorizing Provider  doxycycline (VIBRAMYCIN) 100 MG capsule Take 1 capsule (100 mg total) by mouth 2 (two) times daily. 12/09/20   Varney Biles, MD  Semaglutide, 1 MG/DOSE, (OZEMPIC, 1 MG/DOSE,) 4 MG/3ML SOPN Inject 1 mg into the skin once a week. 10/18/21     TRIJARDY XR 12.5-2.05-998 MG TB24 Take by mouth. 09/16/20   [provider]    Family History Family History  Problem Relation Age of Onset  . Cancer Mother        thyroid  . Diabetes Father   . Hypertension Maternal Grandmother   . Kidney disease Maternal Grandmother   . Diabetes Paternal Grandmother   . Diabetes Paternal Grandfather     Social History Social History   Tobacco Use  .  Smoking status: Never  . Smokeless tobacco: Never  Vaping Use  . Vaping Use: Never used  Substance Use Topics  . Alcohol use: No  . Drug use: No     Allergies   Codeine, Dilaudid [hydromorphone hcl], Other, Peanut-containing drug products, and Percocet [oxycodone-acetaminophen]   Review of Systems Review of Systems  Musculoskeletal:  Positive for back pain.  Per HPI   Physical Exam Triage Vital Signs ED Triage Vitals  Enc Vitals Group     BP 02/17/22 1114 (!) 154/92     Pulse Rate 02/17/22 1114 75     Resp 02/17/22 1114 18     Temp 02/17/22 1114 98.4 F (36.9 C)     Temp Source 02/17/22 1114 Oral     SpO2 02/17/22 1114 96 %     Weight --      Height --      Head Circumference --      Peak Flow --      Pain Score 02/17/22 1113 7     Pain Loc --      Pain Edu? --      Excl. in [place]? --    No data found.  Updated Vital Signs BP (!) 154/92 (BP Location: Left Arm)   Pulse 75   Temp 98.4 F (36.9 C) (Oral)   Resp 18   LMP 01/10/2016   SpO2 96%   Visual Acuity Right Eye Distance:   Left Eye Distance:   Bilateral Distance:    Right Eye Near:   Left Eye Near:    Bilateral Near:     Physical Exam Vitals and nursing note reviewed.  Constitutional:      Appearance: She is not ill-appearing or toxic-appearing.  HENT:     Head: Normocephalic and atraumatic.     Right Ear: Hearing, tympanic membrane, ear canal and external ear normal.     Left Ear: Hearing, tympanic membrane, ear canal and external ear normal.     Nose: Congestion present.     Mouth/Throat:     Lips: Pink.     Mouth: Mucous membranes are moist.     Pharynx: Posterior oropharyngeal erythema present.     Comments: Mild erythema to the posterior oropharynx with evidence of clear post-nasal drainage.  Eyes:     General: Lids are normal. Vision grossly intact. Gaze aligned appropriately.        Right eye: No discharge.        Left eye: No discharge.     Extraocular Movements: Extraocular  movements intact.     Conjunctiva/sclera: Conjunctivae normal.  Cardiovascular:     Rate and Rhythm: Normal  rate and regular rhythm.     Heart sounds: Normal heart sounds, S1 normal and S2 normal.  Pulmonary:     Effort: Pulmonary effort is normal. No respiratory distress.     Breath sounds: Normal breath sounds and air entry.  Musculoskeletal:        General: Normal range of motion.     Right wrist: Normal.     Left wrist: Tenderness present. No swelling, deformity, effusion, lacerations, bony tenderness, snuff box tenderness or crepitus. Normal range of motion. Normal pulse.     Cervical back: Neck supple.     Comments: Phalen's test positive to the   Lymphadenopathy:     Cervical: No cervical adenopathy.  Skin:    General: Skin is warm and dry.     Capillary Refill: Capillary refill takes less than 2 seconds.     Findings: No rash.  Neurological:     General: No focal deficit present.     Mental Status: She is alert and oriented to person, place, and time. Mental status is at baseline.     Cranial Nerves: No dysarthria or facial asymmetry.  Psychiatric:        Mood and Affect: Mood normal.        Speech: Speech normal.        Behavior: Behavior normal.        Thought Content: Thought content normal.        Judgment: Judgment normal.     UC Treatments / Results  Labs (all labs ordered are listed, but only abnormal results are displayed) Labs Reviewed - No data to display  EKG   Radiology No results found.  Procedures Procedures (including critical care time)  Medications Ordered in UC Medications - No data to display  Initial Impression / Assessment and Plan / UC Course  I have reviewed the triage vital signs and the nursing notes.  Pertinent labs & imaging results that were available during my care of the patient were reviewed by me and considered in my medical decision making (see chart for details).     *** Final Clinical Impressions(s) / UC Diagnoses    Final diagnoses:  Viral URI with cough   Discharge Instructions   None    ED Prescriptions   None    PDMP not reviewed this encounter.

## 2022-02-17 NOTE — ED Triage Notes (Signed)
Pt has back pain, scratchy throat, sneezing, ears itching, congestion, coughing up phlegm and body aches since Wed.  Took Theraflu

## 2022-02-17 NOTE — Discharge Instructions (Addendum)
You have a viral upper respiratory infection.  COVID-19 testing is pending. We will call you with results if positive. If your COVID test is positive, you must stay at home until day 6 of symptoms. On day 6, you may go out into public and go back to work, but you must wear a mask until day 11 of symptoms to prevent spread to others.  Use the following medicines to help with symptoms: - Plain Mucinex (guaifenesin) over the counter as directed every 12 hours to thin mucous so that you are able to get it out of your body easier. Drink plenty of water while taking this medication so that it works well in your body (at least 8 cups a day).  - Tylenol 1,000mg and/or ibuprofen 600mg every 6 hours with food as needed for aches/pains or fever/chills.  - Tessalon perles every 8 hours as needed for cough. - Take Promethazine DM cough medication to help with your cough at nighttime so that you are able to sleep. Do not drive, drink alcohol, or go to work while taking this medication since it can make you sleepy. Only take this at nighttime.   1 tablespoon of honey in warm water and/or salt water gargles may also help with symptoms. Humidifier to your room will help add water to the air and reduce coughing.  If you develop any new or worsening symptoms, please return.  If your symptoms are severe, please go to the emergency room.  Follow-up with your primary care provider for further evaluation and management of your symptoms as well as ongoing wellness visits.  I hope you feel better!  

## 2022-02-18 LAB — SARS CORONAVIRUS 2 (TAT 6-24 HRS): SARS Coronavirus 2: NEGATIVE

## 2022-03-14 ENCOUNTER — Ambulatory Visit
Admission: RE | Admit: 2022-03-14 | Discharge: 2022-03-14 | Disposition: A | Discharge status: Home / Self Care | Payer: BC Managed Care – PPO | Source: Ambulatory Visit | Attending: Obstetrics and Gynecology | Admitting: Obstetrics and Gynecology

## 2022-03-14 ENCOUNTER — Other Ambulatory Visit: Payer: Self-pay | Admitting: Obstetrics and Gynecology

## 2022-03-14 DIAGNOSIS — N644 Mastodynia: Secondary | ICD-10-CM

## 2022-03-28 ENCOUNTER — Ambulatory Visit
Admission: RE | Admit: 2022-03-28 | Discharge: 2022-03-28 | Disposition: A | Discharge status: Home / Self Care | Payer: BC Managed Care – PPO | Source: Ambulatory Visit | Attending: Obstetrics and Gynecology | Admitting: Obstetrics and Gynecology

## 2022-03-28 DIAGNOSIS — N644 Mastodynia: Secondary | ICD-10-CM

## 2022-07-04 LAB — COLOGUARD: COLOGUARD: NEGATIVE

## 2022-12-23 IMAGING — DX DG CHEST 2V
2 series · 2 of 2 positions shown · non-contrast
Comparison: February 10, 2020

CLINICAL DATA: Chest pain.

EXAM:
CHEST - 2 VIEW

[chest pa]
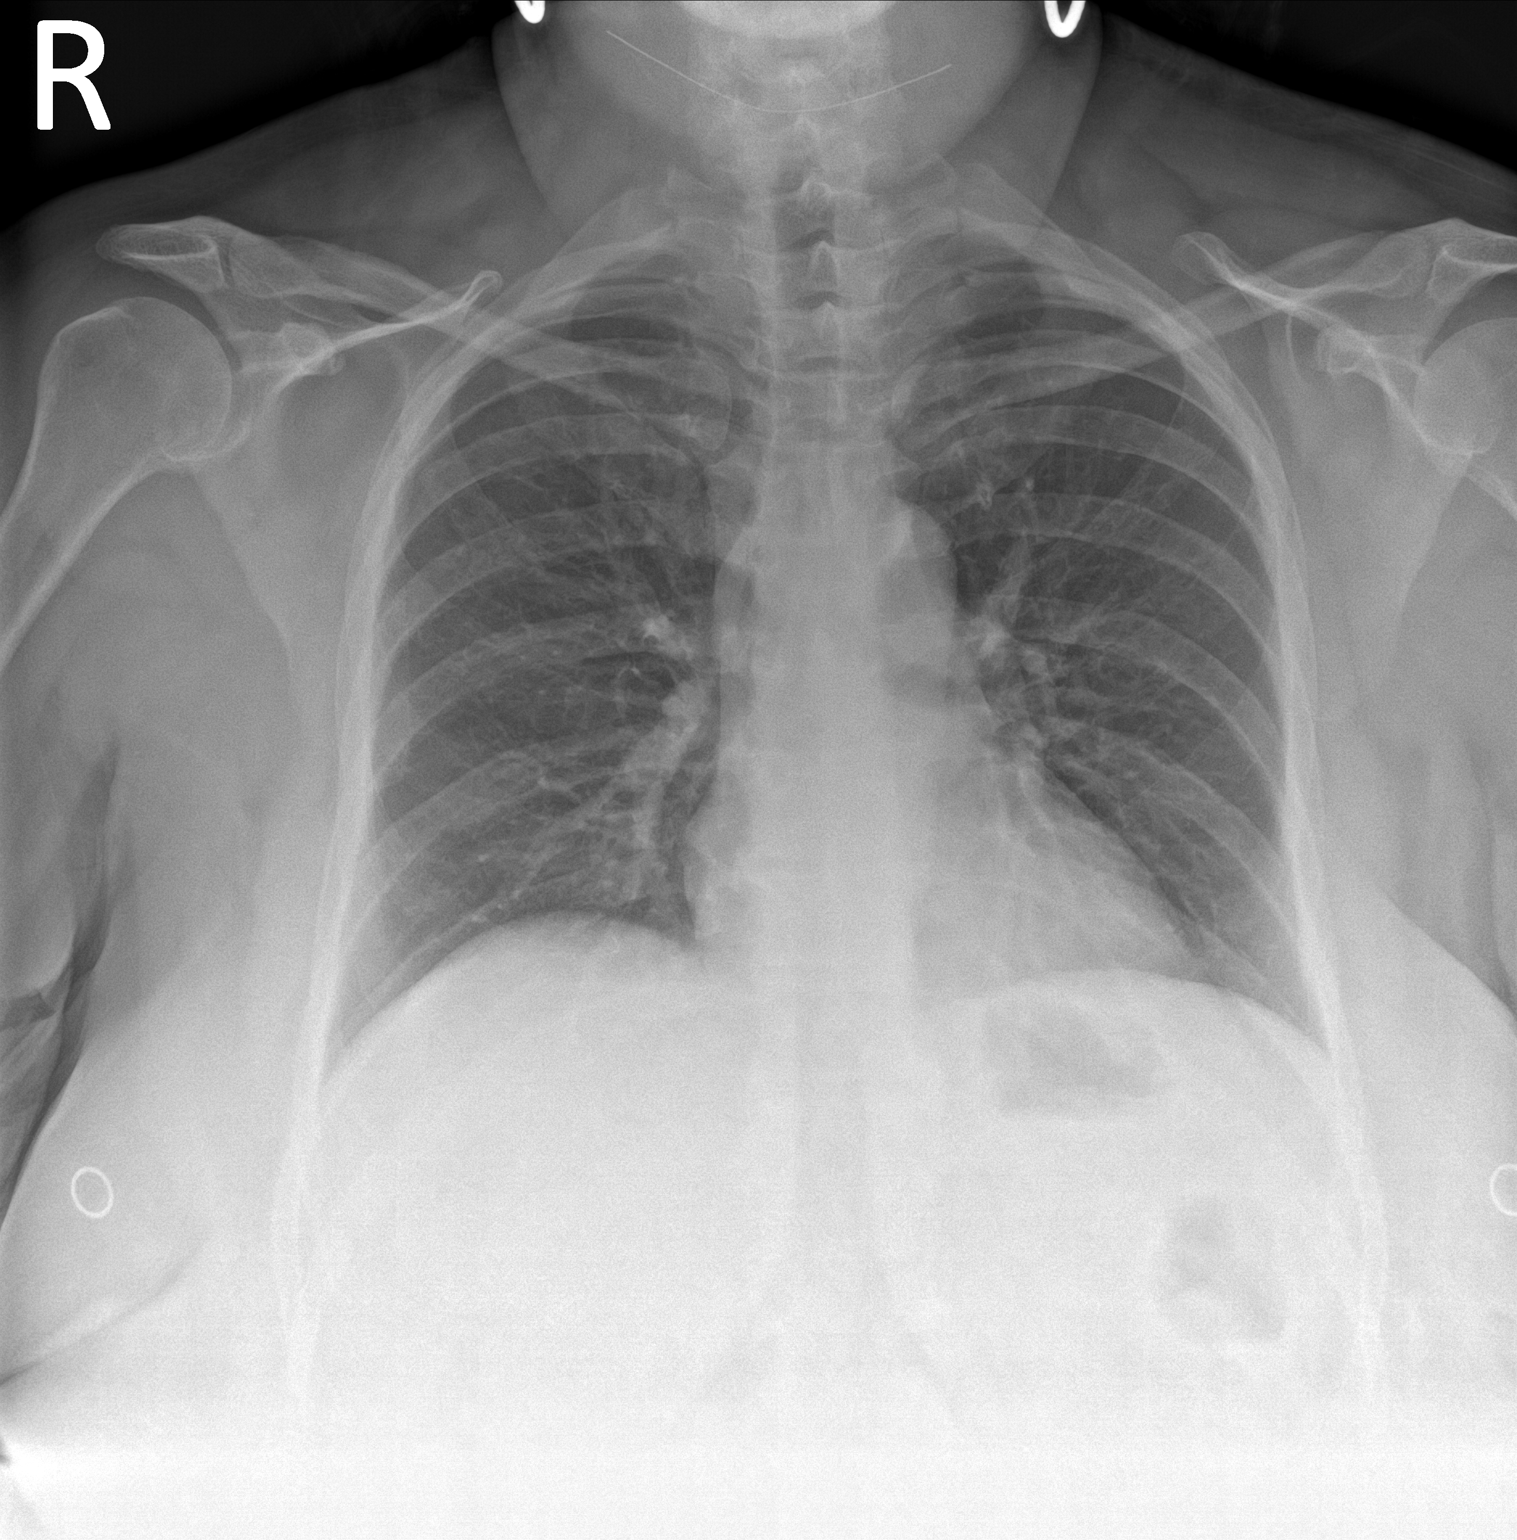

[chest lat]
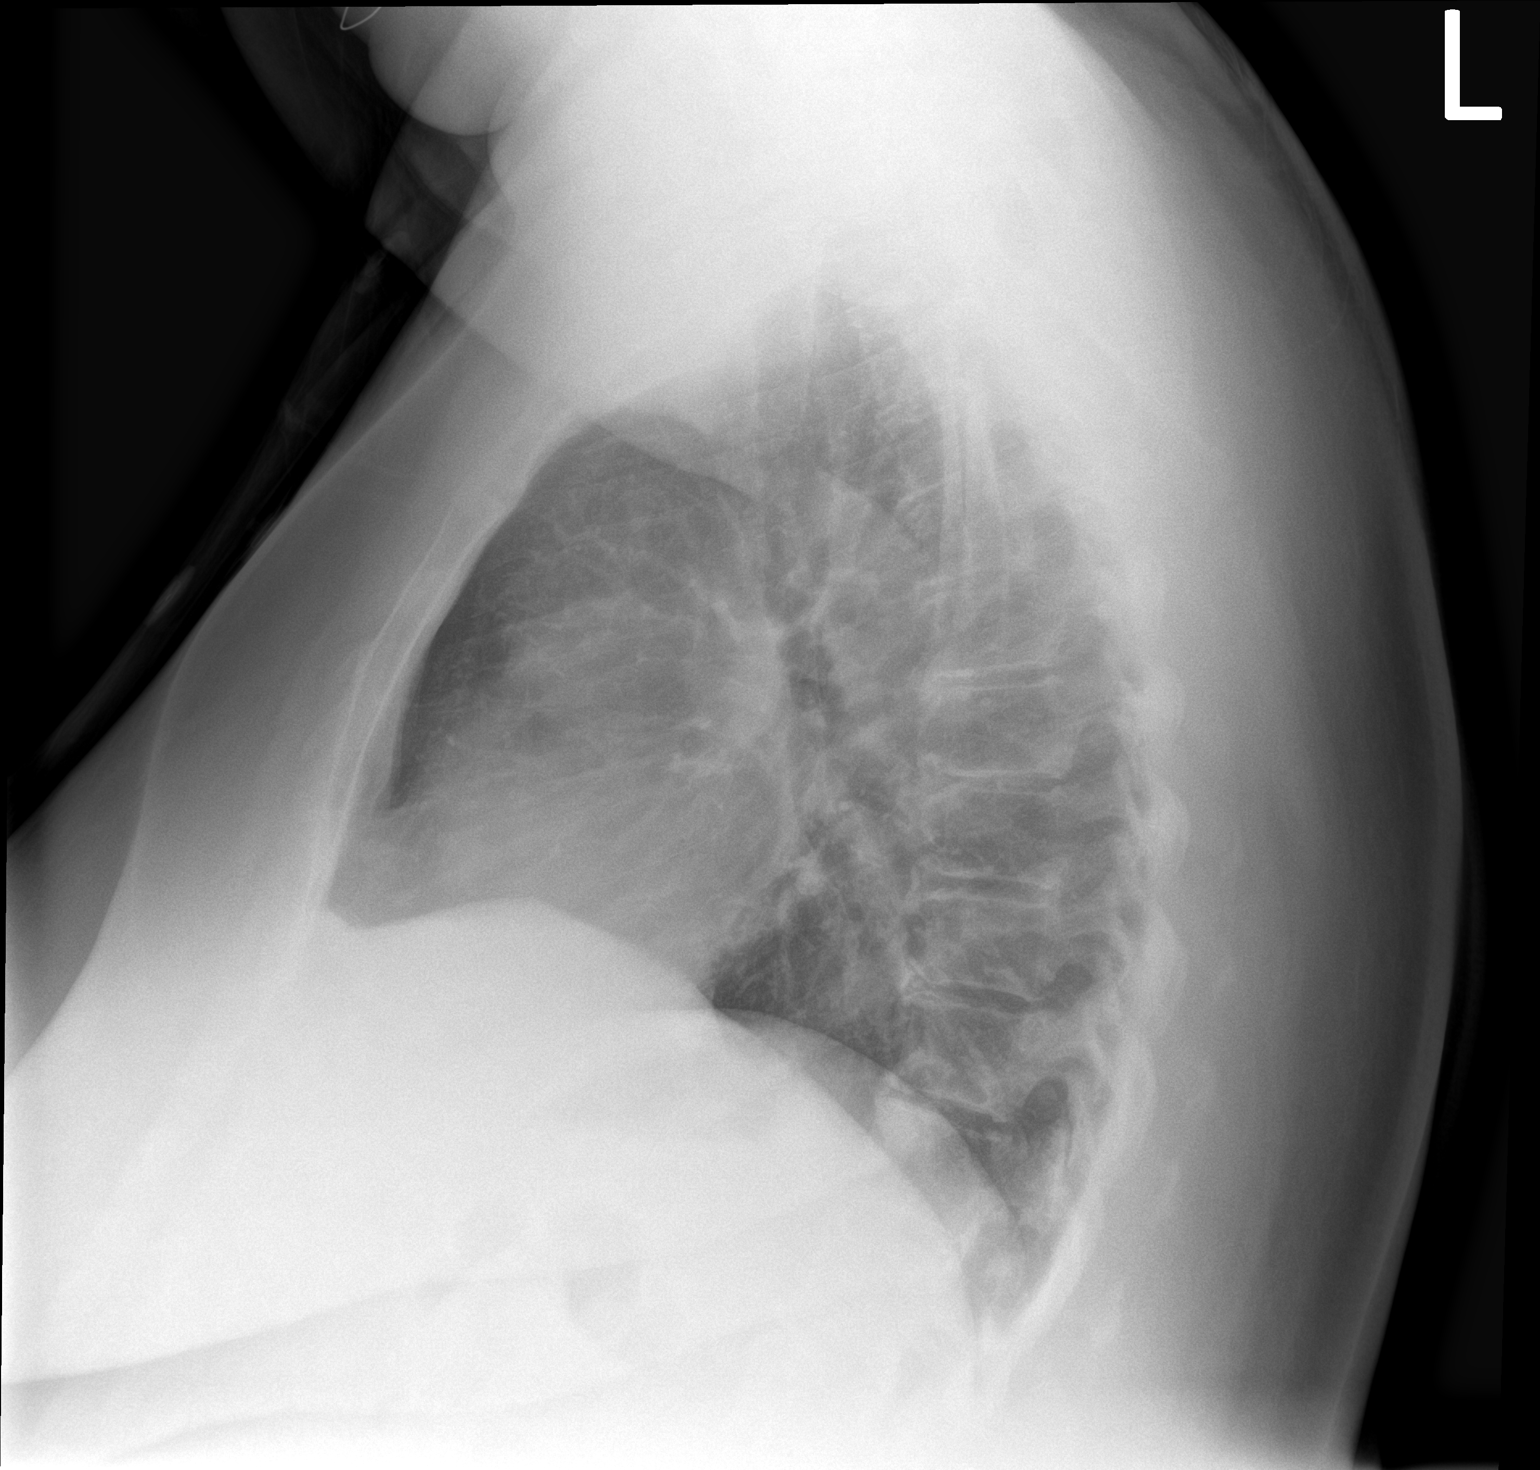

[2 of 2 positions shown; findings below may reference images not displayed]

FINDINGS: The heart size and mediastinal contours are within normal limits.
Both lungs are clear. The visualized skeletal structures are
unremarkable.
IMPRESSION: No active cardiopulmonary disease.

## 2023-04-08 ENCOUNTER — Ambulatory Visit (HOSPITAL_COMMUNITY): Payer: Self-pay
